# Patient Record
Sex: Male | Born: 1970 | Race: White | Hispanic: No | Marital: Single
Health system: Midwestern US, Community
[De-identification: ages and names within clinical notes are randomized; demographics above are authoritative.]

## PROBLEM LIST (undated history)

## (undated) DIAGNOSIS — R69 Illness, unspecified: Secondary | ICD-10-CM

## (undated) DIAGNOSIS — S46219A Strain of muscle, fascia and tendon of other parts of biceps, unspecified arm, initial encounter: Secondary | ICD-10-CM

## (undated) DIAGNOSIS — J45909 Unspecified asthma, uncomplicated: Secondary | ICD-10-CM

## (undated) DIAGNOSIS — Z22322 Carrier or suspected carrier of Methicillin resistant Staphylococcus aureus: Secondary | ICD-10-CM

## (undated) DIAGNOSIS — M5430 Sciatica, unspecified side: Secondary | ICD-10-CM

## (undated) HISTORY — PX: COLON SURGERY: SHX602

## (undated) HISTORY — PX: KNEE ARTHROSCOPY W/ DEBRIDEMENT: SHX1867

## (undated) HISTORY — PX: APPENDECTOMY: SHX54

## (undated) HISTORY — PX: FOOT SURGERY: SHX648

---

## 2004-07-25 ENCOUNTER — Emergency Department: Payer: Self-pay | Admitting: Emergency Medicine

## 2004-08-16 ENCOUNTER — Emergency Department: Payer: Self-pay | Admitting: Unknown Physician Specialty

## 2004-08-17 ENCOUNTER — Inpatient Hospital Stay: Payer: Self-pay | Admitting: Internal Medicine

## 2005-04-07 ENCOUNTER — Emergency Department: Payer: Self-pay | Admitting: Emergency Medicine

## 2005-08-30 ENCOUNTER — Emergency Department: Payer: Self-pay | Admitting: Emergency Medicine

## 2005-09-18 ENCOUNTER — Observation Stay: Payer: Self-pay | Admitting: General Surgery

## 2006-06-29 ENCOUNTER — Emergency Department: Payer: Self-pay | Admitting: General Practice

## 2006-10-22 ENCOUNTER — Emergency Department: Payer: Self-pay | Admitting: Emergency Medicine

## 2014-03-13 ENCOUNTER — Emergency Department: Payer: Self-pay | Admitting: Emergency Medicine

## 2014-05-20 ENCOUNTER — Emergency Department: Payer: Self-pay | Admitting: Internal Medicine

## 2014-06-06 ENCOUNTER — Emergency Department
Admit: 2014-06-06 | Disposition: A | Discharge status: Home / Self Care | Payer: Self-pay | Admitting: Emergency Medicine

## 2014-06-06 LAB — COMPREHENSIVE METABOLIC PANEL
ALT: 19 U/L
ANION GAP: 4 — AB (ref 7–16)
Albumin: 4.1 g/dL
Alkaline Phosphatase: 66 U/L
BUN: 15 mg/dL
Bilirubin,Total: 0.9 mg/dL
CO2: 25 mmol/L
Calcium, Total: 8.6 mg/dL — ABNORMAL LOW
Chloride: 108 mmol/L
Creatinine: 1.11 mg/dL
EGFR (African American): >60
Glucose: 126 mg/dL — ABNORMAL HIGH
POTASSIUM: 3.8 mmol/L
SGOT(AST): 20 U/L
Sodium: 137 mmol/L
Total Protein: 7.1 g/dL

## 2014-06-06 LAB — CBC WITH DIFFERENTIAL/PLATELET
BASOS ABS: 0.1 10*3/uL (ref 0.0–0.1)
BASOS PCT: 0.8 %
EOS ABS: 0.2 10*3/uL (ref 0.0–0.7)
Eosinophil %: 2.5 %
HCT: 51 % (ref 40.0–52.0)
HGB: 17 g/dL (ref 13.0–18.0)
Lymphocyte #: 1.2 10*3/uL (ref 1.0–3.6)
Lymphocyte %: 14.6 %
MCH: 28.9 pg (ref 26.0–34.0)
MCHC: 33.2 g/dL (ref 32.0–36.0)
MCV: 87 fL (ref 80–100)
MONO ABS: 0.8 x10 3/mm (ref 0.2–1.0)
Monocyte %: 10 %
Neutrophil #: 6 10*3/uL (ref 1.4–6.5)
Neutrophil %: 72.1 %
PLATELETS: 237 10*3/uL (ref 150–440)
RBC: 5.88 10*6/uL (ref 4.40–5.90)
RDW: 13.3 % (ref 11.5–14.5)
WBC: 8.3 10*3/uL (ref 3.8–10.6)

## 2014-06-06 LAB — URINALYSIS, COMPLETE
BILIRUBIN, UR: NEGATIVE
Bacteria: NONE SEEN
Glucose,UR: NEGATIVE mg/dL (ref 0–75)
KETONE: NEGATIVE
LEUKOCYTE ESTERASE: NEGATIVE
Nitrite: NEGATIVE
PH: 6 (ref 4.5–8.0)
PROTEIN: NEGATIVE
Specific Gravity: 1.01 (ref 1.003–1.030)
Squamous Epithelial: NONE SEEN

## 2014-06-06 LAB — LIPASE, BLOOD: Lipase: 55 U/L — ABNORMAL HIGH

## 2014-10-13 ENCOUNTER — Emergency Department (HOSPITAL_COMMUNITY)
Admission: EM | Admit: 2014-10-13 | Discharge: 2014-10-13 | Disposition: A | Discharge status: Home / Self Care | Payer: Self-pay | Attending: Emergency Medicine | Admitting: Emergency Medicine

## 2014-10-13 ENCOUNTER — Emergency Department (HOSPITAL_COMMUNITY): Payer: Self-pay

## 2014-10-13 ENCOUNTER — Encounter (HOSPITAL_COMMUNITY): Payer: Self-pay | Admitting: Emergency Medicine

## 2014-10-13 DIAGNOSIS — I10 Essential (primary) hypertension: Secondary | ICD-10-CM | POA: Insufficient documentation

## 2014-10-13 DIAGNOSIS — Z720 Tobacco use: Secondary | ICD-10-CM | POA: Insufficient documentation

## 2014-10-13 DIAGNOSIS — J45909 Unspecified asthma, uncomplicated: Secondary | ICD-10-CM | POA: Insufficient documentation

## 2014-10-13 DIAGNOSIS — M25561 Pain in right knee: Secondary | ICD-10-CM | POA: Insufficient documentation

## 2014-10-13 DIAGNOSIS — Z8614 Personal history of Methicillin resistant Staphylococcus aureus infection: Secondary | ICD-10-CM | POA: Insufficient documentation

## 2014-10-13 HISTORY — DX: Unspecified asthma, uncomplicated: J45.909

## 2014-10-13 HISTORY — DX: Sciatica, unspecified side: M54.30

## 2014-10-13 HISTORY — DX: Carrier or suspected carrier of methicillin resistant Staphylococcus aureus: Z22.322

## 2014-10-13 MED ORDER — IBUPROFEN 800 MG PO TABS: 800.0000 mg | ORAL_TABLET | Freq: Three times a day (TID) | ORAL | Status: DC

## 2014-10-13 MED ORDER — OXYCODONE-ACETAMINOPHEN 5-325 MG PO TABS: 1.0000 | ORAL_TABLET | ORAL | Status: DC | PRN

## 2014-10-13 NOTE — ED Provider Notes (Signed)
CSN: [id number]     Arrival date & time 10/13/14  1232 History   First MD Initiated Contact with Patient 10/13/14 1244     Chief Complaint  Patient presents with  . Knee Pain     (Consider location/radiation/quality/duration/timing/severity/associated sxs/prior Treatment) The history is provided by the patient.   Dean Pierce is a 44 y.o. male with a distant prior history of right knee pain and injury resulting in need for arthroscopic surgery when a teenager presenting with worsened pain along with popping and grinding at the medial knee joint space which is been worse for the past 4 days.  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  He denies swelling, redness at the site.  There is no radiation of pain which is worsened with certain movements specifically with valgus and varus strain of the knee joint.  He also endorses history of MRSA infection in his right lower extremity when he was a teenager.     Past Medical History  Diagnosis Date  . Asthma   . Hypertension   . 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 Sciatic nerve pain    Past Surgical History  Procedure Laterality Date  . Knee arthroscopy w/ debridement Right   . Foot surgery Right     Foot almost amputated  . Appendectomy    . Colon surgery     History reviewed. No pertinent family history. Social History  Substance Use Topics  . Smoking status: Current Every Day Smoker -- 1.00 packs/day    Types: Cigarettes  . Smokeless tobacco: Never Used  . Alcohol Use: Yes     Comment: occassionally    Review of Systems  Constitutional: Negative for fever.  Musculoskeletal: Positive for joint swelling and arthralgias. Negative for myalgias.  Neurological: Negative for weakness and numbness.      Allergies  Morphine and related  Home  Medications   Prior to Admission medications   Medication Sig Start Date End Date Taking? Authorizing Provider  ibuprofen (ADVIL,MOTRIN) 800 MG tablet Take 1 tablet (800 mg total) by mouth 3 (three) times daily. 10/13/14   Burgess Amor, PA-C  oxyCODONE-acetaminophen (PERCOCET/ROXICET) 5-325 MG per tablet Take 1 tablet by mouth every 4 (four) hours as needed. 10/13/14   Burgess Amor, PA-C   BP 112/76 mmHg  Pulse 86  Temp(Src) 98.3 F (36.8 C) (Oral)  Resp 18  Ht 6' (1.829 m)  Wt 220 lb (99.791 kg)  BMI 29.83 kg/m2  SpO2 98% Physical Exam  Constitutional: He appears well-developed and well-nourished.  HENT:  Head: Atraumatic.  Neck: Normal range of motion.  Cardiovascular:  Pulses equal bilaterally  Musculoskeletal: He exhibits tenderness.       Right knee: He exhibits LCL laxity and MCL laxity. He exhibits no swelling, no effusion, no ecchymosis, no deformity, no erythema and normal alignment. Tenderness found. Medial joint line tenderness noted.  Pain with varus and valgus stress.  Negative drawer test.  He does have mild crepitus with range of motion of the joint.  There is no redness, swelling or effusion.  He has no calf or thigh pain.  Neurological: He is alert. He has normal strength. He displays normal reflexes. No sensory deficit.  Skin: Skin is warm and dry.  Psychiatric: He has a normal mood and affect.  ED Course  Procedures (including critical care time) Labs Review Labs Reviewed - No data to display  Imaging Review Dg Knee Complete 4 Views Right  10/13/2014   CLINICAL DATA:  Acute right knee pain after repeated popping. Initial encounter.  EXAM: RIGHT KNEE - COMPLETE 4+ VIEW  COMPARISON:  None.  FINDINGS: There is no evidence of fracture, dislocation, or joint effusion. Mild narrowing of medial joint space is noted. Soft tissues are unremarkable.  IMPRESSION: Mild degenerative joint disease is noted medially. No acute abnormality seen.   Electronically Signed   By: Lupita Raider, M.D.   On: 10/13/2014 13:38   I  EKG Interpretation None      MDM   Final diagnoses:  Knee pain, acute, right     Imaging was reviewed, interpreted and I agree with radiologists reading.  Results were also discussed with patient.  Patient was placed on crutches, prescribed ibuprofen and oxycodone.  Encouraged ice, elevation.  Work note given.  Referral to Dr. Romeo Apple, patient to call for an appointment early next week.  Exam is consistent with medial knee joint problem, suspect either medial ligament strain or medial meniscal injury.     Burgess Amor, PA-C 10/13/14 1443  Gerhard Munch, MD 10/13/14 [phone]

## 2014-10-13 NOTE — Discharge Instructions (Signed)
Knee Pain °The knee is the complex joint between your thigh and your lower leg. It is made up of bones, tendons, ligaments, and cartilage. The bones that make up the knee are: °· The femur in the thigh. °· The tibia and fibula in the lower leg. °· The patella or kneecap riding in the groove on the lower femur. °CAUSES  °Knee pain is a common complaint with many causes. A few of these causes are: °· Injury, such as: °¨ A ruptured ligament or tendon injury. °¨ Torn cartilage. °· Medical conditions, such as: °¨ Gout °¨ Arthritis °¨ Infections °· Overuse, over training, or overdoing a physical activity. °Knee pain can be minor or severe. Knee pain can accompany debilitating injury. Minor knee problems often respond well to self-care measures or get well on their own. More serious injuries may need medical intervention or even surgery. °SYMPTOMS °The knee is complex. Symptoms of knee problems can vary widely. Some of the problems are: °· Pain with movement and weight bearing. °· Swelling and tenderness. °· Buckling of the knee. °· Inability to straighten or extend your knee. °· Your knee locks and you cannot straighten it. °· Warmth and redness with pain and fever. °· Deformity or dislocation of the kneecap. °DIAGNOSIS  °Determining what is wrong may be very straight forward such as when there is an injury. It can also be challenging because of the complexity of the knee. Tests to make a diagnosis may include: °· Your caregiver taking a history and doing a physical exam. °· Routine X-rays can be used to rule out other problems. X-rays will not reveal a cartilage tear. Some injuries of the knee can be diagnosed by: °¨ Arthroscopy a surgical technique by which a small video camera is inserted through tiny incisions on the sides of the knee. This procedure is used to examine and repair internal knee joint problems. Tiny instruments can be used during arthroscopy to repair the torn knee cartilage (meniscus). °¨ Arthrography  is a radiology technique. A contrast liquid is directly injected into the knee joint. Internal structures of the knee joint then become visible on X-ray film. °¨ An MRI scan is a non X-ray radiology procedure in which magnetic fields and a computer produce two- or three-dimensional images of the inside of the knee. Cartilage tears are often visible using an MRI scanner. MRI scans have largely replaced arthrography in diagnosing cartilage tears of the knee. °· Blood work. °· Examination of the fluid that helps to lubricate the knee joint (synovial fluid). This is done by taking a sample out using a needle and a syringe. °TREATMENT °The treatment of knee problems depends on the cause. Some of these treatments are: °· Depending on the injury, proper casting, splinting, surgery, or physical therapy care will be needed. °· Give yourself adequate recovery time. Do not overuse your joints. If you begin to get sore during workout routines, back off. Slow down or do fewer repetitions. °· For repetitive activities such as cycling or running, maintain your strength and nutrition. °· Alternate muscle groups. For example, if you are a weight lifter, work the upper body on one day and the lower body the next. °· Either tight or weak muscles do not give the proper support for your knee. Tight or weak muscles do not absorb the stress placed on the knee joint. Keep the muscles surrounding the knee strong. °· Take care of mechanical problems. °¨ If you have flat feet, orthotics or special shoes may help.   See your caregiver if you need help. °¨ Arch supports, sometimes with wedges on the inner or outer aspect of the heel, can help. These can shift pressure away from the side of the knee most bothered by osteoarthritis. °¨ A brace called an "unloader" brace also may be used to help ease the pressure on the most arthritic side of the knee. °· If your caregiver has prescribed crutches, braces, wraps or ice, use as directed. The acronym  for this is PRICE. This means protection, rest, ice, compression, and elevation. °· Nonsteroidal anti-inflammatory drugs (NSAIDs), can help relieve pain. But if taken immediately after an injury, they may actually increase swelling. Take NSAIDs with food in your stomach. Stop them if you develop stomach problems. Do not take these if you have a history of ulcers, stomach pain, or bleeding from the bowel. Do not take without your caregiver's approval if you have problems with fluid retention, heart failure, or kidney problems. °· For ongoing knee problems, physical therapy may be helpful. °· Glucosamine and chondroitin are over-the-counter dietary supplements. Both may help relieve the pain of osteoarthritis in the knee. These medicines are different from the usual anti-inflammatory drugs. Glucosamine may decrease the rate of cartilage destruction. °· Injections of a corticosteroid drug into your knee joint may help reduce the symptoms of an arthritis flare-up. They may provide pain relief that lasts a few months. You may have to wait a few months between injections. The injections do have a small increased risk of infection, water retention, and elevated blood sugar levels. °· Hyaluronic acid injected into damaged joints may ease pain and provide lubrication. These injections may work by reducing inflammation. A series of shots may give relief for as long as 6 months. °· Topical painkillers. Applying certain ointments to your skin may help relieve the pain and stiffness of osteoarthritis. Ask your pharmacist for suggestions. Many over the-counter products are approved for temporary relief of arthritis pain. °· In some countries, doctors often prescribe topical NSAIDs for relief of chronic conditions such as arthritis and tendinitis. A review of treatment with NSAID creams found that they worked as well as oral medications but without the serious side effects. °PREVENTION °· Maintain a healthy weight. Extra pounds  put more strain on your joints. °· Get strong, stay limber. Weak muscles are a common cause of knee injuries. Stretching is important. Include flexibility exercises in your workouts. °· Be smart about exercise. If you have osteoarthritis, chronic knee pain or recurring injuries, you may need to change the way you exercise. This does not mean you have to stop being active. If your knees ache after jogging or playing basketball, consider switching to swimming, water aerobics, or other low-impact activities, at least for a few days a week. Sometimes limiting high-impact activities will provide relief. °· Make sure your shoes fit well. Choose footwear that is right for your sport. °· Protect your knees. Use the proper gear for knee-sensitive activities. Use kneepads when playing volleyball or laying carpet. Buckle your seat belt every time you drive. Most shattered kneecaps occur in car accidents. °· Rest when you are tired. °SEEK MEDICAL CARE IF:  °You have knee pain that is continual and does not seem to be getting better.  °SEEK IMMEDIATE MEDICAL CARE IF:  °Your knee joint feels hot to the touch and you have a high fever. °MAKE SURE YOU:  °· Understand these instructions. °· Will watch your condition. °· Will get help right away if you are not   doing well or get worse. Document Released: 12/15/2006 Document Revised: 05/12/2011 Document Reviewed: 12/15/2006 [facility] Patient Information 2015 [place], [place]. This information is not intended to replace advice given to you by your health care provider. Make sure you discuss any questions you have with your health care provider.   Your x-rays are negative today for acute source of your pain, although you do have mild arthritis changes in your knee.  I suspect you may have either a medial ligament of meniscal injury (cartilage).  Ice and elevate as much as possible for the next several days.  Use crutches to minimize weightbearing.  Take the anti-inflammatory  prescribed.  You may use the oxycodone if needed for additional pain relief, do not drive with inferoseptal taking this medicine as it will make you drowsy.

## 2014-10-13 NOTE — ED Notes (Signed)
Pain started in rt knee on Monday or Tuesday - Has been getting increasingly more painful with popping and grinding - no Known injury

## 2014-11-05 ENCOUNTER — Emergency Department: Payer: No Typology Code available for payment source

## 2014-11-05 ENCOUNTER — Emergency Department
Admission: EM | Admit: 2014-11-05 | Discharge: 2014-11-05 | Disposition: A | Discharge status: Home / Self Care | Payer: No Typology Code available for payment source | Attending: Emergency Medicine | Admitting: Emergency Medicine

## 2014-11-05 DIAGNOSIS — Y998 Other external cause status: Secondary | ICD-10-CM | POA: Insufficient documentation

## 2014-11-05 DIAGNOSIS — Y9389 Activity, other specified: Secondary | ICD-10-CM | POA: Diagnosis not present

## 2014-11-05 DIAGNOSIS — Z720 Tobacco use: Secondary | ICD-10-CM | POA: Diagnosis not present

## 2014-11-05 DIAGNOSIS — Y92410 Unspecified street and highway as the place of occurrence of the external cause: Secondary | ICD-10-CM | POA: Insufficient documentation

## 2014-11-05 DIAGNOSIS — S8002XA Contusion of left knee, initial encounter: Secondary | ICD-10-CM | POA: Diagnosis not present

## 2014-11-05 DIAGNOSIS — I10 Essential (primary) hypertension: Secondary | ICD-10-CM | POA: Insufficient documentation

## 2014-11-05 DIAGNOSIS — T07XXXA Unspecified multiple injuries, initial encounter: Secondary | ICD-10-CM

## 2014-11-05 DIAGNOSIS — S8992XA Unspecified injury of left lower leg, initial encounter: Secondary | ICD-10-CM | POA: Diagnosis present

## 2014-11-05 MED ORDER — HYDROMORPHONE HCL 1 MG/ML IJ SOLN
1.0000 mg | Freq: Once | INTRAMUSCULAR | Status: AC
  Administered 2014-11-05: 1 mg via INTRAVENOUS
  Filled 2014-11-05: qty 1

## 2014-11-05 NOTE — ED Notes (Signed)
Rigid c collar applied, cms intact before and after application to all extremities.
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
## 2014-11-05 NOTE — Discharge Instructions (Signed)
Contusion A contusion is a deep bruise. Contusions are the result of an injury that caused bleeding under the skin. The contusion may turn blue, purple, or yellow. Minor injuries will give you a painless contusion, but more severe contusions may stay painful and swollen for a few weeks.  CAUSES  A contusion is usually caused by a blow, trauma, or direct force to an area of the body. SYMPTOMS   Swelling and redness of the injured area.  Bruising of the injured area.  Tenderness and soreness of the injured area.  Pain. DIAGNOSIS  The diagnosis can be made by taking a history and physical exam. An X-ray, CT scan, or MRI may be needed to determine if there were any associated injuries, such as fractures. TREATMENT  Specific treatment will depend on what area of the body was injured. In general, the best treatment for a contusion is resting, icing, elevating, and applying cold compresses to the injured area. Over-the-counter medicines may also be recommended for pain control. Ask your caregiver what the best treatment is for your contusion. HOME CARE INSTRUCTIONS   Put ice on the injured area.  Put ice in a plastic bag.  Place a towel between your skin and the bag.  Leave the ice on for 15-20 minutes, 3-4 times a day, or as directed by your health care provider.  Only take over-the-counter or prescription medicines for pain, discomfort, or fever as directed by your caregiver. Your caregiver may recommend avoiding anti-inflammatory medicines (aspirin, ibuprofen, and naproxen) for 48 hours because these medicines may increase bruising.  Rest the injured area.  If possible, elevate the injured area to reduce swelling. SEEK IMMEDIATE MEDICAL CARE IF:   You have increased bruising or swelling.  You have pain that is getting worse.  Your swelling or pain is not relieved with medicines. MAKE SURE YOU:   Understand these instructions.  Will watch your condition.  Will get help right  away if you are not doing well or get worse. Document Released: 11/27/2004 Document Revised: 02/22/2013 Document Reviewed: 12/23/2010 [facility] Patient Information 2015 [place], [place]. This information is not intended to replace advice given to you by your health care provider. Make sure you discuss any questions you have with your health care provider. Please return for worse or new pain and follow-up with your regular doctor for the contusion sister having none of the x-ray showed anything broken. Use Motrin or Tylenol for the pain for now we cannot give a narcotic pain medications for this sort of pain any longer.
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
## 2014-11-09 ENCOUNTER — Emergency Department (HOSPITAL_COMMUNITY)
Admission: EM | Admit: 2014-11-09 | Discharge: 2014-11-09 | Disposition: A | Discharge status: Home / Self Care | Payer: No Typology Code available for payment source | Attending: Emergency Medicine | Admitting: Emergency Medicine

## 2014-11-09 ENCOUNTER — Emergency Department (HOSPITAL_COMMUNITY): Payer: No Typology Code available for payment source

## 2014-11-09 ENCOUNTER — Encounter (HOSPITAL_COMMUNITY): Payer: Self-pay | Admitting: Emergency Medicine

## 2014-11-09 DIAGNOSIS — Z79899 Other long term (current) drug therapy: Secondary | ICD-10-CM | POA: Insufficient documentation

## 2014-11-09 DIAGNOSIS — Z720 Tobacco use: Secondary | ICD-10-CM | POA: Insufficient documentation

## 2014-11-09 DIAGNOSIS — Z91040 Latex allergy status: Secondary | ICD-10-CM | POA: Diagnosis not present

## 2014-11-09 DIAGNOSIS — I10 Essential (primary) hypertension: Secondary | ICD-10-CM | POA: Diagnosis not present

## 2014-11-09 DIAGNOSIS — Z8614 Personal history of Methicillin resistant Staphylococcus aureus infection: Secondary | ICD-10-CM | POA: Diagnosis not present

## 2014-11-09 DIAGNOSIS — Z8739 Personal history of other diseases of the musculoskeletal system and connective tissue: Secondary | ICD-10-CM | POA: Diagnosis not present

## 2014-11-09 DIAGNOSIS — Z791 Long term (current) use of non-steroidal anti-inflammatories (NSAID): Secondary | ICD-10-CM | POA: Diagnosis not present

## 2014-11-09 DIAGNOSIS — S46212A Strain of muscle, fascia and tendon of other parts of biceps, left arm, initial encounter: Secondary | ICD-10-CM | POA: Diagnosis not present

## 2014-11-09 DIAGNOSIS — Y9389 Activity, other specified: Secondary | ICD-10-CM | POA: Diagnosis not present

## 2014-11-09 DIAGNOSIS — Y92410 Unspecified street and highway as the place of occurrence of the external cause: Secondary | ICD-10-CM | POA: Diagnosis not present

## 2014-11-09 DIAGNOSIS — Y998 Other external cause status: Secondary | ICD-10-CM | POA: Insufficient documentation

## 2014-11-09 DIAGNOSIS — J45909 Unspecified asthma, uncomplicated: Secondary | ICD-10-CM | POA: Insufficient documentation

## 2014-11-09 DIAGNOSIS — S46812A Strain of other muscles, fascia and tendons at shoulder and upper arm level, left arm, initial encounter: Secondary | ICD-10-CM | POA: Diagnosis not present

## 2014-11-09 DIAGNOSIS — S4992XA Unspecified injury of left shoulder and upper arm, initial encounter: Secondary | ICD-10-CM | POA: Diagnosis present

## 2014-11-09 MED ORDER — OXYCODONE-ACETAMINOPHEN 5-325 MG PO TABS
1.0000 | ORAL_TABLET | Freq: Once | ORAL | Status: DC
  Filled 2014-11-09: qty 1

## 2014-11-09 MED ORDER — IBUPROFEN 800 MG PO TABS
800.0000 mg | ORAL_TABLET | Freq: Once | ORAL | Status: AC
  Administered 2014-11-09: 800 mg via ORAL
  Filled 2014-11-09: qty 1

## 2014-11-09 MED ORDER — METHOCARBAMOL 500 MG PO TABS: 500.0000 mg | ORAL_TABLET | Freq: Three times a day (TID) | ORAL | Status: DC

## 2014-11-09 NOTE — Discharge Instructions (Signed)
Your examination is consistent with trapezius strain and bicep tricep strain on the left. Please use your sling over the next 4 or 5 days. Please continue your current medication. Please add Robaxin 3 times daily. Please see Dr. Hilda Lias, or the orthopedic specialist of your choice if not improving by Monday, September 12. Shoulder Sprain A shoulder sprain is the result of damage to the tough, fiber-like tissues (ligaments) that help hold your shoulder in place. The ligaments may be stretched or torn. Besides the main shoulder joint (the ball and socket), there are several smaller joints that connect the bones in this area. A sprain usually involves one of those joints. Most often it is the acromioclavicular (or AC) joint. That is the joint that connects the collarbone (clavicle) and the shoulder blade (scapula) at the top point of the shoulder blade (acromion). A shoulder sprain is a mild form of what is called a shoulder separation. Recovering from a shoulder sprain may take some time. For some, pain lingers for several months. Most people recover without long term problems. CAUSES   A shoulder sprain is usually caused by some kind of trauma. This might be:  Falling on an outstretched arm.  Being hit hard on the shoulder.  Twisting the arm.  Shoulder sprains are more likely to occur in people who:  Play sports.  Have balance or coordination problems. SYMPTOMS   Pain when you move your shoulder.  Limited ability to move the shoulder.  Swelling and tenderness on top of the shoulder.  Redness or warmth in the shoulder.  Bruising.  A change in the shape of the shoulder. DIAGNOSIS  Your healthcare provider may:  Ask about your symptoms.  Ask about recent activity that might have caused those symptoms.  Examine your shoulder. You may be asked to do simple exercises to test movement. The other shoulder will be examined for comparison.  Order some tests that provide a look inside  the body. They can show the extent of the injury. The tests could include:  X-rays.  CT (computed tomography) scan.  MRI (magnetic resonance imaging) scan. RISKS AND COMPLICATIONS  Loss of full shoulder motion.  Ongoing shoulder pain. TREATMENT  How long it takes to recover from a shoulder sprain depends on how severe it was. Treatment options may include:  Rest. You should not use the arm or shoulder until it heals.  Ice. For 2 or 3 days after the injury, put an ice pack on the shoulder up to 4 times a day. It should stay on for 15 to 20 minutes each time. Wrap the ice in a towel so it does not touch your skin.  Over-the-counter medicine to relieve pain.  A sling or brace. This will keep the arm still while the shoulder is healing.  Physical therapy or rehabilitation exercises. These will help you regain strength and motion. Ask your healthcare provider when it is OK to begin these exercises.  Surgery. The need for surgery is rare with a sprained shoulder, but some people may need surgery to keep the joint in place and reduce pain. HOME CARE INSTRUCTIONS   Ask your healthcare provider about what you should and should not do while your shoulder heals.  Make sure you know how to apply ice to the correct area of your shoulder.  Talk with your healthcare provider about which medications should be used for pain and swelling.  If rehabilitation therapy will be needed, ask your healthcare provider to refer you to a  therapist. If it is not recommended, then ask about at-home exercises. Find out when exercise should begin. SEEK MEDICAL CARE IF:  Your pain, swelling, or redness at the joint increases. SEEK IMMEDIATE MEDICAL CARE IF:   You have a fever.  You cannot move your arm or shoulder. Document Released: 07/06/2008 Document Revised: 05/12/2011 Document Reviewed: 07/06/2008 [facility] Patient Information 2015 [employer], [place]. This information is not intended to replace advice  given to you by your health care provider. Make sure you discuss any questions you have with your health care provider.
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
## 2014-11-09 NOTE — ED Notes (Signed)
Pt verbalized understanding of no driving and to use caution within 4 hours of taking pain meds due to meds cause drowsiness 

## 2015-01-27 ENCOUNTER — Emergency Department (HOSPITAL_COMMUNITY): Payer: Self-pay

## 2015-01-27 ENCOUNTER — Encounter (HOSPITAL_COMMUNITY): Payer: Self-pay | Admitting: *Deleted

## 2015-01-27 ENCOUNTER — Emergency Department (HOSPITAL_COMMUNITY)
Admission: EM | Admit: 2015-01-27 | Discharge: 2015-01-28 | Disposition: A | Discharge status: Home / Self Care | Payer: Self-pay | Attending: Emergency Medicine | Admitting: Emergency Medicine

## 2015-01-27 DIAGNOSIS — Z8614 Personal history of Methicillin resistant Staphylococcus aureus infection: Secondary | ICD-10-CM | POA: Insufficient documentation

## 2015-01-27 DIAGNOSIS — J209 Acute bronchitis, unspecified: Secondary | ICD-10-CM

## 2015-01-27 DIAGNOSIS — Z91040 Latex allergy status: Secondary | ICD-10-CM | POA: Insufficient documentation

## 2015-01-27 DIAGNOSIS — J45901 Unspecified asthma with (acute) exacerbation: Secondary | ICD-10-CM | POA: Insufficient documentation

## 2015-01-27 DIAGNOSIS — F17210 Nicotine dependence, cigarettes, uncomplicated: Secondary | ICD-10-CM | POA: Insufficient documentation

## 2015-01-27 DIAGNOSIS — H9209 Otalgia, unspecified ear: Secondary | ICD-10-CM | POA: Insufficient documentation

## 2015-01-27 MED ORDER — HYDROCODONE-ACETAMINOPHEN 5-325 MG PO TABS
1.0000 | ORAL_TABLET | Freq: Once | ORAL | Status: AC
  Administered 2015-01-27: 1 via ORAL
  Filled 2015-01-27: qty 1

## 2015-01-27 MED ORDER — LEVALBUTEROL HCL 1.25 MG/0.5ML IN NEBU
1.2500 mg | INHALATION_SOLUTION | Freq: Once | RESPIRATORY_TRACT | Status: AC
  Administered 2015-01-27: 1.25 mg via RESPIRATORY_TRACT
  Filled 2015-01-27: qty 0.5

## 2015-01-27 MED ORDER — PREDNISONE 50 MG PO TABS
60.0000 mg | ORAL_TABLET | Freq: Once | ORAL | Status: AC
  Administered 2015-01-27: 60 mg via ORAL
  Filled 2015-01-27: qty 1

## 2015-01-27 NOTE — ED Notes (Addendum)
Pt c/o chest and nasal congestion, cough, headache, ear pain, and body aches since Thursday night.  Pt c/o chest pain when he coughs and feels gassy.

## 2015-01-28 MED ORDER — HYDROCODONE-ACETAMINOPHEN 5-325 MG PO TABS: 1.0000 | ORAL_TABLET | ORAL | Status: DC | PRN

## 2015-01-28 MED ORDER — PREDNISONE 10 MG PO TABS: ORAL_TABLET | ORAL | Status: DC

## 2015-01-28 MED ORDER — ALBUTEROL SULFATE HFA 108 (90 BASE) MCG/ACT IN AERS
1.0000 | INHALATION_SPRAY | Freq: Once | RESPIRATORY_TRACT | Status: AC
  Administered 2015-01-28: 1 via RESPIRATORY_TRACT
  Filled 2015-01-28: qty 6.7

## 2015-01-28 NOTE — Discharge Instructions (Signed)
Acute Bronchitis Bronchitis is inflammation of the airways that extend from the windpipe into the lungs (bronchi). The inflammation often causes mucus to develop. This leads to a cough, which is the most common symptom of bronchitis.  In acute bronchitis, the condition usually develops suddenly and goes away over time, usually in a couple weeks. Smoking, allergies, and asthma can make bronchitis worse. Repeated episodes of bronchitis may cause further lung problems.  CAUSES Acute bronchitis is most often caused by the same virus that causes a cold. The virus can spread from person to person (contagious) through coughing, sneezing, and touching contaminated objects. SIGNS AND SYMPTOMS   Cough.   Fever.   Coughing up mucus.   Body aches.   Chest congestion.   Chills.   Shortness of breath.   Sore throat.  DIAGNOSIS  Acute bronchitis is usually diagnosed through a physical exam. Your health care provider will also ask you questions about your medical history. Tests, such as chest X-rays, are sometimes done to rule out other conditions.  TREATMENT  Acute bronchitis usually goes away in a couple weeks. Oftentimes, no medical treatment is necessary. Medicines are sometimes given for relief of fever or cough. Antibiotic medicines are usually not needed but may be prescribed in certain situations. In some cases, an inhaler may be recommended to help reduce shortness of breath and control the cough. A cool mist vaporizer may also be used to help thin bronchial secretions and make it easier to clear the chest.  HOME CARE INSTRUCTIONS  Get plenty of rest.   Drink enough fluids to keep your urine clear or pale yellow (unless you have a medical condition that requires fluid restriction). Increasing fluids may help thin your respiratory secretions (sputum) and reduce chest congestion, and it will prevent dehydration.   Take medicines only as directed by your health care provider.  If  you were prescribed an antibiotic medicine, finish it all even if you start to feel better.  Avoid smoking and secondhand smoke. Exposure to cigarette smoke or irritating chemicals will make bronchitis worse. If you are a smoker, consider using nicotine gum or skin patches to help control withdrawal symptoms. Quitting smoking will help your lungs heal faster.   Reduce the chances of another bout of acute bronchitis by washing your hands frequently, avoiding people with cold symptoms, and trying not to touch your hands to your mouth, nose, or eyes.   Keep all follow-up visits as directed by your health care provider.  SEEK MEDICAL CARE IF: Your symptoms do not improve after 1 week of treatment.  SEEK IMMEDIATE MEDICAL CARE IF:  You develop an increased fever or chills.   You have chest pain.   You have severe shortness of breath.  You have bloody sputum.   You develop dehydration.  You faint or repeatedly feel like you are going to pass out.  You develop repeated vomiting.  You develop a severe headache. MAKE SURE YOU:   Understand these instructions.  Will watch your condition.  Will get help right away if you are not doing well or get worse.   This information is not intended to replace advice given to you by your health care provider. Make sure you discuss any questions you have with your health care provider.   Document Released: 03/27/2004 Document Revised: 03/10/2014 Document Reviewed: 08/10/2012 Elsevier Interactive Patient Education 2016 [employer]   Take your next dose of prednisone tomorrow evening.  Use 1-2 puffs of your albuterol inhaler every  4 hours if you are wheezing, coughing or short of breath.  You may take the hydrocodone prescribed for assistance with cough suppression.  This will make you drowsy - do not drive within 4 hours of taking this medication.

## 2015-01-28 NOTE — ED Notes (Signed)
Pt alert & oriented x4, stable gait. Patient given discharge instructions, paperwork & prescription(s). Patient  instructed to stop at the registration desk to finish any additional paperwork. Patient verbalized understanding. Pt left department w/ no further questions. 

## 2015-01-29 NOTE — ED Provider Notes (Signed)
CSN: [id number]     Arrival date & time 01/27/15  1849 History   First MD Initiated Contact with Patient 01/27/15 2233     Chief Complaint  Patient presents with  . Nasal Congestion     (Consider location/radiation/quality/duration/timing/severity/associated sxs/prior Treatment) The history is provided by the patient and the spouse.   Dean Pierce is a 44 y.o. male with a history of asthma, presenting with a 2 day history of cough productive of yellow sputum, wheezing which is worsened when supine, chest congestion and tightness along with nasal congestion, ear and headache.  He describes burning chest pain with coughing and shortness of breath.  He has taken otc cough remedy mucinex without relief.  He does not currently have any asthma medicines, reports has not had active wheezing or asthma flair in over a year.      Past Medical History  Diagnosis Date  . Asthma   . 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 Sciatic nerve pain    Past Surgical History  Procedure Laterality Date  . Knee arthroscopy w/ debridement Right   . Foot surgery Right     Foot almost amputated  . Appendectomy    . Colon surgery     History reviewed. No pertinent family history. Social History  Substance Use Topics  . Smoking status: Current Every Day Smoker -- 1.00 packs/day    Types: Cigarettes  . Smokeless tobacco: Never Used  . Alcohol Use: Yes     Comment: occassionally    Review of Systems  Constitutional: Negative for fever and chills.  HENT: Positive for congestion, ear pain, postnasal drip, rhinorrhea and sore throat. Negative for ear discharge, sinus pressure, trouble swallowing and voice change.   Eyes: Negative for discharge.  Respiratory: Positive for cough, chest tightness, shortness of breath and wheezing. Negative for stridor.   Cardiovascular: Negative for chest pain.  Gastrointestinal: Negative for nausea, vomiting and abdominal  pain.  Genitourinary: Negative.   Musculoskeletal: Positive for arthralgias.  Skin: Negative.       Allergies  Codeine; Morphine and related; and Latex  Home Medications   Prior to Admission medications   Medication Sig Start Date End Date Taking? Authorizing Provider  PE-Diphenhydramine-DM-GG-APAP (MUCINEX FAST-MAX DAY/NIGHT TAB PO) Take 2 tablets by mouth every 4 (four) hours as needed (for congestion).   Yes Historical Provider, MD  HYDROcodone-acetaminophen (NORCO/VICODIN) 5-325 MG tablet Take 1 tablet by mouth every 4 (four) hours as needed (cough). 01/28/15   Burgess Amor, PA-C  ibuprofen (ADVIL,MOTRIN) 800 MG tablet Take 1 tablet (800 mg total) by mouth 3 (three) times daily. Patient not taking: Reported on 01/27/2015 10/13/14   Burgess Amor, PA-C  methocarbamol (ROBAXIN) 500 MG tablet Take 1 tablet (500 mg total) by mouth 3 (three) times daily. Patient not taking: Reported on 01/27/2015 11/09/14   Ivery Quale, PA-C  oxyCODONE-acetaminophen (PERCOCET/ROXICET) 5-325 MG per tablet Take 1 tablet by mouth every 4 (four) hours as needed. Patient not taking: Reported on 01/27/2015 10/13/14   Burgess Amor, PA-C  predniSONE (DELTASONE) 10 MG tablet 6, 5, 4, 3, 2 then 1 tablet by mouth daily for 6 days total. 01/28/15   Burgess Amor, PA-C   BP 125/70 mmHg  Pulse 87  Temp(Src) 98.6 F (37 C) (Oral)  Resp 20  Ht 6' (1.829 m)  Wt 99.791 kg  BMI 29.83 kg/m2  SpO2 95% Physical Exam  Constitutional: He is oriented to person, place, and  time. He appears well-developed and well-nourished.  HENT:  Head: Normocephalic and atraumatic.  Right Ear: Tympanic membrane and ear canal normal.  Left Ear: Tympanic membrane and ear canal normal.  Nose: Mucosal edema and rhinorrhea present.  Mouth/Throat: Uvula is midline, oropharynx is clear and moist and mucous membranes are normal. No oropharyngeal exudate, posterior oropharyngeal edema, posterior oropharyngeal erythema or tonsillar abscesses.  Eyes:  Conjunctivae are normal.  Neck: Full passive range of motion without pain.  Cardiovascular: Normal rate and normal heart sounds.   Pulmonary/Chest: Effort normal. No respiratory distress. He has wheezes. He has no rales.  Wheezing throughout with prolonged expirations. No rhonchi appreciated.  Abdominal: Soft. There is no tenderness.  Musculoskeletal: Normal range of motion.  Neurological: He is alert and oriented to person, place, and time.  Skin: Skin is warm and dry. No rash noted.  Psychiatric: He has a normal mood and affect.    ED Course  Procedures (including critical care time)  Imaging Review    CLINICAL DATA: Productive cough wheezing shortness of breath headache for 2 days  EXAM: CHEST 2 VIEW  COMPARISON: 05/20/2014  FINDINGS: The heart size and mediastinal contours are within normal limits. Both lungs are clear. The visualized skeletal structures are unremarkable.  IMPRESSION: No active cardiopulmonary disease.   Electronically Signed By: Esperanza Heiraymond Rubner M.D. On: 01/27/2015 21:10    I have personally reviewed and evaluated these images and lab results as part of my medical decision-making.   EKG Interpretation   Date/Time:  Saturday January 27 2015 22:10:13 EST Ventricular Rate:  90 PR Interval:  131 QRS Duration: 92 QT Interval:  352 QTC Calculation: 431 R Axis:   83 Text Interpretation:  Sinus rhythm Baseline wander When compared with ECG  of 05/20/2014 No significant change was found Confirmed by [facility]  MD,  Nicholos JohnsKATHLEEN [id number]) on 01/27/2015 11:01:38 PM      MDM   Final diagnoses:  Acute bronchitis with bronchospasm    Pt given xopenex as he endorses jitteriness with albuterol nebs.  Prednisone 60 mg PO given.  Hydrocodone tab for cough suppression.  Pt re-examined and had much improved aeration with less wheezing throughout.  He was given an albuterol mdi for home use, prednisone taper, hydrocodone for cough suppression.  Advised  recheck here for any worsened sx.  Rest, increased fluid intake, may continue mucinex for congestion sx.    The patient appears reasonably screened and/or stabilized for discharge and I doubt any other medical condition or other [facility] requiring further screening, evaluation, or treatment in the ED at this time prior to discharge.     Burgess AmorJulie Leeta Grimme, PA-C 01/29/15 2349  Dione Boozeavid Glick, MD 01/30/15 [phone]

## 2015-12-07 ENCOUNTER — Encounter: Payer: Self-pay | Admitting: Emergency Medicine

## 2015-12-07 ENCOUNTER — Emergency Department: Payer: Self-pay

## 2015-12-07 ENCOUNTER — Emergency Department
Admission: EM | Admit: 2015-12-07 | Discharge: 2015-12-07 | Disposition: A | Discharge status: Home / Self Care | Payer: Self-pay | Attending: Emergency Medicine | Admitting: Emergency Medicine

## 2015-12-07 DIAGNOSIS — Z791 Long term (current) use of non-steroidal anti-inflammatories (NSAID): Secondary | ICD-10-CM | POA: Insufficient documentation

## 2015-12-07 DIAGNOSIS — F17210 Nicotine dependence, cigarettes, uncomplicated: Secondary | ICD-10-CM | POA: Insufficient documentation

## 2015-12-07 DIAGNOSIS — J45909 Unspecified asthma, uncomplicated: Secondary | ICD-10-CM | POA: Insufficient documentation

## 2015-12-07 DIAGNOSIS — Z79899 Other long term (current) drug therapy: Secondary | ICD-10-CM | POA: Insufficient documentation

## 2015-12-07 DIAGNOSIS — R0789 Other chest pain: Secondary | ICD-10-CM | POA: Insufficient documentation

## 2015-12-07 LAB — BASIC METABOLIC PANEL
Anion gap: 5 (ref 5–15)
BUN: 14 mg/dL (ref 6–20)
CALCIUM: 9.2 mg/dL (ref 8.9–10.3)
CO2: 28 mmol/L (ref 22–32)
CREATININE: 1.13 mg/dL (ref 0.61–1.24)
Chloride: 107 mmol/L (ref 101–111)
GFR calc non Af Amer: >60 mL/min (ref >60)
GLUCOSE: 99 mg/dL (ref 65–99)
Potassium: 4.1 mmol/L (ref 3.5–5.1)
Sodium: 140 mmol/L (ref 135–145)

## 2015-12-07 LAB — TROPONIN I: Troponin I: <0.03 ng/mL (ref <0.03)

## 2015-12-07 LAB — CBC
HEMATOCRIT: 48.9 % (ref 40.0–52.0)
Hemoglobin: 17.1 g/dL (ref 13.0–18.0)
MCH: 30.1 pg (ref 26.0–34.0)
MCHC: 34.9 g/dL (ref 32.0–36.0)
MCV: 86.1 fL (ref 80.0–100.0)
Platelets: 176 10*3/uL (ref 150–440)
RBC: 5.68 MIL/uL (ref 4.40–5.90)
RDW: 13.2 % (ref 11.5–14.5)
WBC: 7.5 10*3/uL (ref 3.8–10.6)

## 2015-12-07 LAB — HEPATIC FUNCTION PANEL
ALK PHOS: 57 U/L (ref 38–126)
ALT: 21 U/L (ref 17–63)
AST: 19 U/L (ref 15–41)
Albumin: 4.1 g/dL (ref 3.5–5.0)
Bilirubin, Direct: 0.1 mg/dL (ref 0.1–0.5)
Indirect Bilirubin: 1.1 mg/dL — ABNORMAL HIGH (ref 0.3–0.9)
Total Bilirubin: 1.2 mg/dL (ref 0.3–1.2)
Total Protein: 6.9 g/dL (ref 6.5–8.1)

## 2015-12-07 LAB — LIPASE, BLOOD: LIPASE: 18 U/L (ref 11–51)

## 2015-12-07 NOTE — ED Provider Notes (Signed)
[facility] Emergency Department Provider Note  ____________________________________________   I have reviewed the triage vital signs and the nursing notes.   HISTORY  Chief Complaint Chest Pain    HPI Dean Pierce is a 45 y.o. male who presents today complaining of chest wall pain. He states he feels "sore". Patient tore a biceps muscle and has had ongoing recurrent pectoralis muscle on that side for several months. He states that it feels like that but he also has acid indigestion and sometimes that acts up. He is not sure what is whatsoever to come and get checked out. The pain is in the costochondral region, is worse when he moves his arm he does frequent our motions or work. He denies any exertional symptoms he is not short of breath and is not pleuritic ascending or calf pain or swelling he has no personal or family history of PE DVT he's had no recent travel, he is not taking any estrogen obviously and he has had no recent surgery. Patient has no risk factors for PE and otherwise. He states that he has had this pain for 2 weeks. It is worse when he moves his arm at work where he has to carry 15-20 pound objects. It is not worse when he exercises otherwise or exerts himself.It is better when he takes pain medication. There is no radiation. No other associated abdominal pain. Hurts when he touches it. He is very sure it is a pulled muscle but he wants to ensure that there is no other pathology present.     Past Medical History:  Diagnosis Date  . Asthma   . MRSA (methicillin resistant Staphylococcus aureus) carrier    Infection rt leg  . Sciatic nerve pain     There are no active problems to display for this patient.   Past Surgical History:  Procedure Laterality Date  . APPENDECTOMY    . COLON SURGERY    . FOOT SURGERY Right    Foot almost amputated  . KNEE ARTHROSCOPY W/ DEBRIDEMENT Right     Prior to Admission medications   Medication Sig  Start Date End Date Taking? Authorizing Provider  HYDROcodone-acetaminophen (NORCO/VICODIN) 5-325 MG tablet Take 1 tablet by mouth every 4 (four) hours as needed (cough). 01/28/15   Burgess Amor, PA-C  ibuprofen (ADVIL,MOTRIN) 800 MG tablet Take 1 tablet (800 mg total) by mouth 3 (three) times daily. Patient not taking: Reported on 01/27/2015 10/13/14   Burgess Amor, PA-C  methocarbamol (ROBAXIN) 500 MG tablet Take 1 tablet (500 mg total) by mouth 3 (three) times daily. Patient not taking: Reported on 01/27/2015 11/09/14   Ivery Quale, PA-C  oxyCODONE-acetaminophen (PERCOCET/ROXICET) 5-325 MG per tablet Take 1 tablet by mouth every 4 (four) hours as needed. Patient not taking: Reported on 01/27/2015 10/13/14   Burgess Amor, PA-C  PE-Diphenhydramine-DM-GG-APAP (MUCINEX FAST-MAX DAY/NIGHT TAB PO) Take 2 tablets by mouth every 4 (four) hours as needed (for congestion).    Historical Provider, MD  predniSONE (DELTASONE) 10 MG tablet 6, 5, 4, 3, 2 then 1 tablet by mouth daily for 6 days total. 01/28/15   Burgess Amor, PA-C    Allergies Codeine; Morphine and related; and Latex  No family history on file.  Social History Social History  Substance Use Topics  . Smoking status: Current Every Day Smoker    Packs/day: 0.50    Types: Cigarettes  . Smokeless tobacco: Never Used  . Alcohol use No     Comment: occassionally  Review of Systems Constitutional: No fever/chills Eyes: No visual changes. ENT: No sore throat. No stiff neck no neck pain Cardiovascular: Denies chest pain. Respiratory: Denies shortness of breath. Gastrointestinal:   no vomiting.  No diarrhea.  No constipation. Genitourinary: Negative for dysuria. Musculoskeletal: Negative lower extremity swelling Skin: Negative for rash. Neurological: Negative for severe headaches, focal weakness or numbness. 10-point ROS otherwise negative.  ____________________________________________   PHYSICAL EXAM:  VITAL SIGNS: ED Triage Vitals  [12/07/15 1226]  Enc Vitals Group     BP (!) 134/93     Pulse Rate 83     Resp 18     Temp 98.7 F (37.1 C)     Temp Source Oral     SpO2 98 %     Weight 230 lb (104.3 kg)     Height 6' (1.829 m)     Head Circumference      Peak Flow      Pain Score 2     Pain Loc      Pain Edu?      Excl. in GC?     Constitutional: Alert and oriented. Well appearing and in no acute distress. Eyes: Conjunctivae are normal. PERRL. EOMI. Head: Atraumatic. Nose: No congestion/rhinnorhea. Mouth/Throat: Mucous membranes are moist.  Oropharynx non-erythematous. Neck: No stridor.   Nontender with no meningismus Cardiovascular: Normal rate, regular rhythm. Grossly normal heart sounds.  Good peripheral circulation. Respiratory: Normal respiratory effort.  No retractions. Lungs CTAB. Chest: Tender to palpation in the bilateral chest wall anteriorly at the costochondral margin metastasis area patient states "ouch that's the pain right there" and pulls back. There is no evidence of shingles or crepitus or flail chest or any other lesions noted. There is no cellulitis. There is no inflammation externally noted. There is no mass.  Abdominal: Soft and nontender. No distention. No guarding no rebound Back:  There is no focal tenderness or step off.  there is no midline tenderness there are no lesions noted. there is no CVA tenderness Musculoskeletal: No lower extremity tenderness, no upper extremity tenderness. No joint effusions, no DVT signs strong distal pulses no edema Neurologic:  Normal speech and language. No gross focal neurologic deficits are appreciated.  Skin:  Skin is warm, dry and intact. No rash noted. Psychiatric: Mood and affect are normal. Speech and behavior are normal.  ____________________________________________   LABS (all labs ordered are listed, but only abnormal results are displayed)  Labs Reviewed  HEPATIC FUNCTION PANEL - Abnormal; Notable for the following:       Result Value    Indirect Bilirubin 1.1 (*)    All other components within normal limits  BASIC METABOLIC PANEL  CBC  TROPONIN I  LIPASE, BLOOD  TROPONIN I   ____________________________________________  EKG  I personally interpreted any EKGs ordered by me or triage Normal sinus rhythm rate 88 bpm no acute ST elevation or depression nonspecific ST changes ____________________________________________  RADIOLOGY  I reviewed any imaging ordered by me or triage that were performed during my shift and, if possible, patient and/or family made aware of any abnormal findings. ____________________________________________   PROCEDURES  Procedure(s) performed: None  Procedures  Critical Care performed: None  ____________________________________________   INITIAL IMPRESSION / ASSESSMENT AND PLAN / ED COURSE  Pertinent labs & imaging results that were available during my care of the patient were reviewed by me and considered in my medical decision making (see chart for details).  Patient with very reproducible chest wall pain for 2  weeks, troponin negative, abdomen benign.At this time, there does not appear to be clinical evidence to support the diagnosis of pulmonary embolus, dissection, myocarditis, endocarditis, pericarditis, pericardial tamponade, acute coronary syndrome, pneumothorax, pneumonia, or any other acute intrathoracic pathology that will require admission or acute intervention. Nor is there evidence of any significant intra-abdominal pathology causing this discomfort.  Clinical Course   ____________________________________________   FINAL CLINICAL IMPRESSION(S) / ED DIAGNOSES  Final diagnoses:  None      This chart was dictated using voice recognition software.  Despite best efforts to proofread,  errors can occur which can change meaning.      Jeanmarie PlantJames A Kellis Topete, MD 12/07/15 Ernestina [id number]

## 2015-12-07 NOTE — ED Triage Notes (Signed)
Says chest pain since yesterday.  Says it comes a goes

## 2016-06-04 ENCOUNTER — Emergency Department: Payer: Self-pay

## 2016-06-04 ENCOUNTER — Encounter: Payer: Self-pay | Admitting: Emergency Medicine

## 2016-06-04 DIAGNOSIS — Z87891 Personal history of nicotine dependence: Secondary | ICD-10-CM | POA: Insufficient documentation

## 2016-06-04 DIAGNOSIS — J45909 Unspecified asthma, uncomplicated: Secondary | ICD-10-CM | POA: Insufficient documentation

## 2016-06-04 DIAGNOSIS — R002 Palpitations: Secondary | ICD-10-CM | POA: Insufficient documentation

## 2016-06-04 LAB — CBC
HEMATOCRIT: 47.8 % (ref 40.0–52.0)
Hemoglobin: 16.4 g/dL (ref 13.0–18.0)
MCH: 29.7 pg (ref 26.0–34.0)
MCHC: 34.3 g/dL (ref 32.0–36.0)
MCV: 86.6 fL (ref 80.0–100.0)
PLATELETS: 203 10*3/uL (ref 150–440)
RBC: 5.52 MIL/uL (ref 4.40–5.90)
RDW: 12.8 % (ref 11.5–14.5)
WBC: 7 10*3/uL (ref 3.8–10.6)

## 2016-06-04 NOTE — ED Triage Notes (Signed)
Pt presents to ED with c/o intermittent "fluttering" in his chest that makes him cough. Has been ongoing for the past couple of weeks. Pain now radiating down his left arm. Pt states he also has a torn bicep in his left arm which could be contributing to his arm pain. Denies sob or nausea. Pt reports he is feeling very anxious about his current symptoms.

## 2016-06-05 ENCOUNTER — Emergency Department
Admission: EM | Admit: 2016-06-05 | Discharge: 2016-06-05 | Disposition: A | Discharge status: Home / Self Care | Payer: Self-pay | Attending: Emergency Medicine | Admitting: Emergency Medicine

## 2016-06-05 DIAGNOSIS — R002 Palpitations: Secondary | ICD-10-CM

## 2016-06-05 HISTORY — DX: Strain of muscle, fascia and tendon of other parts of biceps, unspecified arm, initial encounter: S46.219A

## 2016-06-05 LAB — BASIC METABOLIC PANEL
ANION GAP: 4 — AB (ref 5–15)
BUN: 19 mg/dL (ref 6–20)
CO2: 28 mmol/L (ref 22–32)
Calcium: 8.9 mg/dL (ref 8.9–10.3)
Chloride: 106 mmol/L (ref 101–111)
Creatinine, Ser: 1.09 mg/dL (ref 0.61–1.24)
GFR calc Af Amer: >60 mL/min (ref >60)
GFR calc non Af Amer: >60 mL/min (ref >60)
GLUCOSE: 113 mg/dL — AB (ref 65–99)
POTASSIUM: 3.5 mmol/L (ref 3.5–5.1)
Sodium: 138 mmol/L (ref 135–145)

## 2016-06-05 LAB — TROPONIN I: Troponin I: <0.03 ng/mL (ref <0.03)

## 2016-06-05 NOTE — Discharge Instructions (Signed)
As we discussed, your workup today was reassuring.  Though we do not know exactly what is causing your symptoms, it appears that you have no emergent medical condition at this time and that you are safe to go home and follow up as recommended in this paperwork.  We recommend you take a daily baby aspirin, at least until you follow up with cardiology for additional evaluation and treatment.  Please return immediately to the Emergency Department if you develop any new or worsening symptoms that concern you.

## 2016-06-05 NOTE — ED Provider Notes (Signed)
[facility] Emergency Department Provider Note  ____________________________________________   First MD Initiated Contact with Patient 06/05/16 [phone]     (approximate)  I have reviewed the triage vital signs and the nursing notes.   HISTORY  Chief Complaint Chest Pain    HPI Dean Pierce is a 46 y.o. male with a generally reassuring medical history who presents for evaluation of a fluttering sensation in his chest.  He reports that it has been going on for years but it is been more noticeable over the last few weeks.  It happens at least once a day and is very brief, lasting only a second or 2, but it is concerning to him.  He feels it right at the base of his sternum and he is not certain if it is his heart, his stomach, his esophagus, or his diaphragm.  It sometimes makes him cough or catch his breath.He denies chest pain and any other shortness of breath.  He reports that he has a torn bicep tendon and he is having some pain in his left arm throughout the left arm but it does not feel like it is radiating from the chest and he is not sure if it is related.  He describes symptoms as mild but concerning to him and persistent for a long period of time.  He does not have a history of high blood pressure, diabetes, high cholesterol, nor any first-degree relatives with a history of ACS/MI.  He quit smoking about 3 months ago.   Past Medical History:  Diagnosis Date  . Asthma   . Biceps muscle tear   . MRSA (methicillin resistant Staphylococcus aureus) carrier    Infection rt leg  . Sciatic nerve pain     There are no active problems to display for this patient.   Past Surgical History:  Procedure Laterality Date  . APPENDECTOMY    . COLON SURGERY    . FOOT SURGERY Right    Foot almost amputated  . KNEE ARTHROSCOPY W/ DEBRIDEMENT Right     Prior to Admission medications   Medication Sig Start Date End Date Taking? Authorizing Provider    HYDROcodone-acetaminophen (NORCO/VICODIN) 5-325 MG tablet Take 1 tablet by mouth every 4 (four) hours as needed (cough). 01/28/15   Burgess Amor, PA-C  ibuprofen (ADVIL,MOTRIN) 800 MG tablet Take 1 tablet (800 mg total) by mouth 3 (three) times daily. Patient not taking: Reported on 01/27/2015 10/13/14   Burgess Amor, PA-C  methocarbamol (ROBAXIN) 500 MG tablet Take 1 tablet (500 mg total) by mouth 3 (three) times daily. Patient not taking: Reported on 01/27/2015 11/09/14   Ivery Quale, PA-C  oxyCODONE-acetaminophen (PERCOCET/ROXICET) 5-325 MG per tablet Take 1 tablet by mouth every 4 (four) hours as needed. Patient not taking: Reported on 01/27/2015 10/13/14   Burgess Amor, PA-C  PE-Diphenhydramine-DM-GG-APAP (MUCINEX FAST-MAX DAY/NIGHT TAB PO) Take 2 tablets by mouth every 4 (four) hours as needed (for congestion).    Historical Provider, MD  predniSONE (DELTASONE) 10 MG tablet 6, 5, 4, 3, 2 then 1 tablet by mouth daily for 6 days total. 01/28/15   Burgess Amor, PA-C    Allergies Codeine; Morphine and related; and Latex  History reviewed. No pertinent family history.  Social History Social History  Substance Use Topics  . Smoking status: Former Smoker    Packs/day: 0.00    Types: Cigarettes  . Smokeless tobacco: Never Used  . Alcohol use No     Comment: occassionally  Review of Systems Constitutional: No fever/chills Eyes: No visual changes. ENT: No sore throat. Cardiovascular: Denies chest pain. Daily palpitations. Respiratory: Denies shortness of breath. Gastrointestinal: No abdominal pain.  No nausea, no vomiting.  No diarrhea.  No constipation. Genitourinary: Negative for dysuria. Musculoskeletal: Negative for back pain. Skin: Negative for rash. Neurological: Negative for headaches, focal weakness or numbness.  10-point ROS otherwise negative.  ____________________________________________   PHYSICAL EXAM:  VITAL SIGNS: ED Triage Vitals  Enc Vitals Group     BP  06/04/16 2346 (!) 145/96     Pulse Rate 06/04/16 2346 84     Resp 06/04/16 2346 18     Temp 06/04/16 2346 97.9 F (36.6 C)     Temp Source 06/04/16 2346 Oral     SpO2 06/04/16 2346 99 %     Weight 06/04/16 2342 245 lb (111.1 kg)     Height 06/04/16 2342 6' (1.829 m)     Head Circumference --      Peak Flow --      Pain Score 06/04/16 2342 6     Pain Loc --      Pain Edu? --      Excl. in GC? --     Constitutional: Alert and oriented. Well appearing and in no acute distress. Eyes: Conjunctivae are normal. PERRL. EOMI. Head: Atraumatic. Nose: No congestion/rhinnorhea. Mouth/Throat: Mucous membranes are moist. Neck: No stridor.  No meningeal signs.   Cardiovascular: Normal rate, regular rhythm. Good peripheral circulation. Grossly normal heart sounds. Respiratory: Normal respiratory effort.  No retractions. Lungs CTAB. Gastrointestinal: Soft and nontender. No distention.  Musculoskeletal: No lower extremity tenderness nor edema. No gross deformities of extremities. Neurologic:  Normal speech and language. No gross focal neurologic deficits are appreciated.  Skin:  Skin is warm, dry and intact. No rash noted. Psychiatric: Mood and affect are normal. Speech and behavior are normal.  ____________________________________________   LABS (all labs ordered are listed, but only abnormal results are displayed)  Labs Reviewed  BASIC METABOLIC PANEL - Abnormal; Notable for the following:       Result Value   Glucose, Bld 113 (*)    Anion gap 4 (*)    All other components within normal limits  CBC  TROPONIN I   ____________________________________________  EKG  ED ECG REPORT I, Angelena Sand, the attending physician, personally viewed and interpreted this ECG.  Date: 06/04/2016 EKG Time: 23:42 Rate: 74 Rhythm: normal sinus rhythm QRS Axis: normal Intervals: normal ST/T Wave abnormalities: normal Conduction Disturbances: none Narrative Interpretation:  unremarkable  ____________________________________________  RADIOLOGY   Dg Chest 2 View  Result Date: 06/05/2016 CLINICAL DATA:  Midchest pain and left upper extremity discomfort for 2 weeks. EXAM: CHEST  2 VIEW COMPARISON:  12/07/2015 FINDINGS: The lungs are clear. The pulmonary vasculature is normal. Heart size is normal. Hilar and mediastinal contours are unremarkable. There is no pleural effusion. IMPRESSION: No active cardiopulmonary disease. Electronically Signed   By: Ellery Plunk M.D.   On: 06/05/2016 00:03    ____________________________________________   PROCEDURES  Critical Care performed: No   Procedure(s) performed:   Procedures   ____________________________________________   INITIAL IMPRESSION / ASSESSMENT AND PLAN / ED COURSE  Pertinent labs & imaging results that were available during my care of the patient were reviewed by me and considered in my medical decision making (see chart for details).  The patient is having some palpitations which may represent PACs or PVCs or even SVT, but it is very brief  and nonsustained.  He has a HEART score of 2 which is low risk.  He may benefit from further cardiovascular workup but he does not require admission at this time.  I explained to them my thought process including how this may be esophageal spasms or even acid reflux as well and I encouraged him to try PPI.  I gave him the name and number of the patient navigate her to sit up with PCP as well as with a local cardiologist with whom he can follow-up.  Given the duration of symptoms is no indication for a repeat troponin.  I gave my usual and customary return precautions.         ____________________________________________  FINAL CLINICAL IMPRESSION(S) / ED DIAGNOSES  Final diagnoses:  Palpitations     MEDICATIONS GIVEN DURING THIS VISIT:  Medications - No data to display   NEW OUTPATIENT MEDICATIONS STARTED DURING THIS VISIT:  New Prescriptions    No medications on file    Modified Medications   No medications on file    Discontinued Medications   No medications on file     Note:  This document was prepared using Dragon voice recognition software and may include unintentional dictation errors.    Loleta Rose, MD 06/05/16 0200

## 2016-08-19 ENCOUNTER — Encounter: Payer: Self-pay | Admitting: Emergency Medicine

## 2016-08-19 ENCOUNTER — Emergency Department
Admission: EM | Admit: 2016-08-19 | Discharge: 2016-08-19 | Disposition: A | Discharge status: Home / Self Care | Payer: Self-pay | Attending: Emergency Medicine | Admitting: Emergency Medicine

## 2016-08-19 DIAGNOSIS — F17210 Nicotine dependence, cigarettes, uncomplicated: Secondary | ICD-10-CM | POA: Insufficient documentation

## 2016-08-19 DIAGNOSIS — Z91040 Latex allergy status: Secondary | ICD-10-CM | POA: Insufficient documentation

## 2016-08-19 DIAGNOSIS — J45909 Unspecified asthma, uncomplicated: Secondary | ICD-10-CM | POA: Insufficient documentation

## 2016-08-19 DIAGNOSIS — J0100 Acute maxillary sinusitis, unspecified: Secondary | ICD-10-CM | POA: Insufficient documentation

## 2016-08-19 MED ORDER — CETIRIZINE HCL 5 MG/5ML PO SOLN
5.0000 mg | Freq: Once | ORAL | Status: AC
  Administered 2016-08-19: 5 mg via ORAL
  Filled 2016-08-19: qty 5

## 2016-08-19 MED ORDER — DIPHENHYDRAMINE HCL 25 MG PO CAPS: 25.0000 mg | ORAL_CAPSULE | ORAL | 0 refills | Status: DC | PRN

## 2016-08-19 MED ORDER — METHYLPREDNISOLONE SODIUM SUCC 125 MG IJ SOLR
125.0000 mg | Freq: Once | INTRAMUSCULAR | Status: AC
  Administered 2016-08-19: 125 mg via INTRAMUSCULAR
  Filled 2016-08-19: qty 2

## 2016-08-19 MED ORDER — FLUTICASONE PROPIONATE 50 MCG/ACT NA SUSP: 2.0000 | Freq: Every day | NASAL | 0 refills | Status: DC

## 2016-08-19 NOTE — ED Notes (Signed)
Pt states sinus pressure since Thursday. Points to underneath eyes, forehead, and top of head. Denies coughing anything. Denies fever. Pt appears congested.

## 2016-08-19 NOTE — ED Provider Notes (Signed)
[facility] Emergency Department Provider Note  ____________________________________________  Time seen: Approximately 7:48 PM  I have reviewed the triage vital signs and the nursing notes.   HISTORY  Chief Complaint Nasal Congestion    HPI Dean Pierce is a 46 y.o. male that presents to emergency department with 5 days of nasal congestion. Patient states that he was planting a garden at work when he inhaled a bunch of peat moss. The next morning he blew green moss out of his nose. His allergies have been bothering him since. He states there is a lot of pressure in his cheeks and forehead. He is sneezing but unable to blow anything out of his nose. He has taken Mucinex for symptoms, which has not helped. When he takes oral steroids, he gets irritable. He denies fever, shortness breath, cough, chest pain, nausea, vomiting, abdominal pain.   Past Medical History:  Diagnosis Date  . Asthma   . Biceps muscle tear   . MRSA (methicillin resistant Staphylococcus aureus) carrier    Infection rt leg  . Sciatic nerve pain     There are no active problems to display for this patient.   Past Surgical History:  Procedure Laterality Date  . APPENDECTOMY    . COLON SURGERY    . FOOT SURGERY Right    Foot almost amputated  . KNEE ARTHROSCOPY W/ DEBRIDEMENT Right     Prior to Admission medications   Medication Sig Start Date End Date Taking? Authorizing Provider  diphenhydrAMINE (BENADRYL) 25 mg capsule Take 1 capsule (25 mg total) by mouth every 4 (four) hours as needed. 08/19/16 08/19/17  Enid Derry, PA-C  fluticasone (FLONASE) 50 MCG/ACT nasal spray Place 2 sprays into both nostrils daily. 08/19/16 08/19/17  Enid Derry, PA-C  HYDROcodone-acetaminophen (NORCO/VICODIN) 5-325 MG tablet Take 1 tablet by mouth every 4 (four) hours as needed (cough). 01/28/15   Burgess Amor, PA-C  ibuprofen (ADVIL,MOTRIN) 800 MG tablet Take 1 tablet (800 mg total) by mouth 3  (three) times daily. Patient not taking: Reported on 01/27/2015 10/13/14   Burgess Amor, PA-C  methocarbamol (ROBAXIN) 500 MG tablet Take 1 tablet (500 mg total) by mouth 3 (three) times daily. Patient not taking: Reported on 01/27/2015 11/09/14   Ivery Quale, PA-C  oxyCODONE-acetaminophen (PERCOCET/ROXICET) 5-325 MG per tablet Take 1 tablet by mouth every 4 (four) hours as needed. Patient not taking: Reported on 01/27/2015 10/13/14   Burgess Amor, PA-C  PE-Diphenhydramine-DM-GG-APAP (MUCINEX FAST-MAX DAY/NIGHT TAB PO) Take 2 tablets by mouth every 4 (four) hours as needed (for congestion).    [provider]  predniSONE (DELTASONE) 10 MG tablet 6, 5, 4, 3, 2 then 1 tablet by mouth daily for 6 days total. 01/28/15   Idol, Raynelle Fanning, PA-C    Allergies Codeine; Morphine and related; and Latex  No family history on file.  Social History Social History  Substance Use Topics  . Smoking status: Current Every Day Smoker    Packs/day: 0.50    Types: Cigarettes  . Smokeless tobacco: Never Used  . Alcohol use No     Comment: occassionally     Review of Systems  Constitutional: No fever/chills Cardiovascular: No chest pain. Respiratory: No cough. No SOB. Gastrointestinal: No abdominal pain.  No nausea, no vomiting.  Musculoskeletal: Negative for musculoskeletal pain. Skin: Negative for rash, abrasions, lacerations, ecchymosis. Neurological: Negative for headaches, numbness or tingling   ____________________________________________   PHYSICAL EXAM:  VITAL SIGNS: ED Triage Vitals  Enc Vitals Group  BP 08/19/16 1853 129/89     Pulse Rate 08/19/16 1853 97     Resp 08/19/16 1853 20     Temp 08/19/16 1853 99.2 F (37.3 C)     Temp Source 08/19/16 1853 Oral     SpO2 08/19/16 1853 98 %     Weight 08/19/16 1853 245 lb (111.1 kg)     Height 08/19/16 1853 6' (1.829 m)     Head Circumference --      Peak Flow --      Pain Score 08/19/16 1908 8     Pain Loc --      Pain Edu? --       Excl. in GC? --      Constitutional: Alert and oriented. Well appearing and in no acute distress. Eyes: Conjunctivae are normal. PERRL. EOMI. Head: Atraumatic. ENT: Maxillary sinus tenderness.      Ears: Tympanic membranes pearly gray bilaterally.      Nose: No congestion/rhinnorhea.       Mouth/Throat: Mucous membranes are moist.  Neck: No stridor. Cardiovascular: Normal rate, regular rhythm.  Good peripheral circulation. Respiratory: Normal respiratory effort without tachypnea or retractions. Lungs CTAB. Good air entry to the bases with no decreased or absent breath sounds. Musculoskeletal: Full range of motion to all extremities. No gross deformities appreciated. Neurologic:  Normal speech and language. No gross focal neurologic deficits are appreciated.  Skin:  Skin is warm, dry and intact. No rash noted. Psychiatric: Mood and affect are normal. Speech and behavior are normal. Patient exhibits appropriate insight and judgement.   ____________________________________________   LABS (all labs ordered are listed, but only abnormal results are displayed)  Labs Reviewed - No data to display ____________________________________________  EKG   ____________________________________________  RADIOLOGY  No results found.  ____________________________________________    PROCEDURES  Procedure(s) performed:    Procedures    Medications  methylPREDNISolone sodium succinate (SOLU-MEDROL) 125 mg/2 mL injection 125 mg (125 mg Intramuscular Given 08/19/16 2028)  cetirizine HCl (Zyrtec) 5 MG/5ML solution 5 mg (5 mg Oral Given 08/19/16 2027)     ____________________________________________   INITIAL IMPRESSION / ASSESSMENT AND PLAN / ED COURSE  Pertinent labs & imaging results that were available during my care of the patient were reviewed by me and considered in my medical decision making (see chart for details).  Review of the [place] CSRS was performed in accordance of  the [employer] prior to dispensing any controlled drugs.   Patient's diagnosis is consistent with sinusitis. Vital signs and exam are reassuring. We discussed antibiotics and patient would like to hold off on antibiotics at this time. He was given Solu-Medrol and zyrtec in ED. He does not want to do oral steroids. Patient is to follow up with PCP as directed. Patient is given ED precautions to return to the ED for any worsening or new symptoms.     ____________________________________________  FINAL CLINICAL IMPRESSION(S) / ED DIAGNOSES  Final diagnoses:  Acute maxillary sinusitis, recurrence not specified      NEW MEDICATIONS STARTED DURING THIS VISIT:  Discharge Medication List as of 08/19/2016  8:19 PM    START taking these medications   Details  diphenhydrAMINE (BENADRYL) 25 mg capsule Take 1 capsule (25 mg total) by mouth every 4 (four) hours as needed., Starting Tue 08/19/2016, Until Wed 08/19/2017, Print    fluticasone (FLONASE) 50 MCG/ACT nasal spray Place 2 sprays into both nostrils daily., Starting Tue 08/19/2016, Until Wed 08/19/2017, Print  This chart was dictated using voice recognition software/Dragon. Despite best efforts to proofread, errors can occur which can change the meaning. Any change was purely unintentional.    Enid Derry, PA-C 08/19/16 2049    Pershing Proud Myra Rude, MD 08/19/16 [phone]

## 2016-08-19 NOTE — ED Triage Notes (Signed)
Patient ambulatory to triage with steady gait, without difficulty or distress noted  Pt reports sinus pressure/drainage since Thursday evening after working in [employer]

## 2016-11-11 ENCOUNTER — Ambulatory Visit
Admission: EM | Admit: 2016-11-11 | Discharge: 2016-11-11 | Disposition: A | Discharge status: Home / Self Care | Payer: Self-pay | Attending: Family Medicine | Admitting: Family Medicine

## 2016-11-11 DIAGNOSIS — J069 Acute upper respiratory infection, unspecified: Secondary | ICD-10-CM

## 2016-11-11 MED ORDER — ALBUTEROL SULFATE HFA 108 (90 BASE) MCG/ACT IN AERS: 1.0000 | INHALATION_SPRAY | Freq: Four times a day (QID) | RESPIRATORY_TRACT | 0 refills | Status: DC | PRN

## 2016-11-11 MED ORDER — HYDROCOD POLST-CPM POLST ER 10-8 MG/5ML PO SUER: 5.0000 mL | Freq: Two times a day (BID) | ORAL | 0 refills | Status: DC

## 2016-11-11 MED ORDER — BENZONATATE 200 MG PO CAPS: ORAL_CAPSULE | ORAL | 0 refills | Status: DC

## 2016-11-11 NOTE — ED Triage Notes (Signed)
Patient complains of chest congestion, nasal congestion, sinus pain and pressure, cough and headaches that started on Saturday.

## 2016-11-11 NOTE — ED Provider Notes (Signed)
[facility]    CSN: [id number] Arrival date & time: 11/11/16  1106     History   Chief Complaint Chief Complaint  Patient presents with  . Cough    HPI Dean Pierce is a 46 y.o. male.   HPI  Is a 46 year old male who presents with history of chest congestion, nasal congestion and rhinorrhea, sinus pain and pressure that has gone into his chest creating a cough particularly at nighttime causing him to sleep in a semi-reclining position. He states that he is coughing up gray mucousy type of sputum. His denies any fever or chills. He smokes about a half a pack a day, down from 2 packs per day that he was smoking a while back. He states several of his  coworkers have had similar symptoms.       Past Medical History:  Diagnosis Date  . Asthma   . Biceps muscle tear   . MRSA (methicillin resistant Staphylococcus aureus) carrier    Infection rt leg  . Sciatic nerve pain     There are no active problems to display for this patient.   Past Surgical History:  Procedure Laterality Date  . APPENDECTOMY    . COLON SURGERY    . FOOT SURGERY Right    Foot almost amputated  . KNEE ARTHROSCOPY W/ DEBRIDEMENT Right        Home Medications    Prior to Admission medications   Medication Sig Start Date End Date Taking? Authorizing Provider  fluticasone (FLONASE) 50 MCG/ACT nasal spray Place 2 sprays into both nostrils daily. 08/19/16 08/19/17 Yes Enid DerryWagner, Ashley, PA-C  ibuprofen (ADVIL,MOTRIN) 800 MG tablet Take 1 tablet (800 mg total) by mouth 3 (three) times daily. 10/13/14  Yes Idol, Raynelle FanningJulie, PA-C  PE-Diphenhydramine-DM-GG-APAP (MUCINEX FAST-MAX DAY/NIGHT TAB PO) Take 2 tablets by mouth every 4 (four) hours as needed (for congestion).   Yes [provider]  albuterol (PROVENTIL HFA;VENTOLIN HFA) 108 (90 Base) MCG/ACT inhaler Inhale 1-2 puffs into the lungs every 6 (six) hours as needed for wheezing or shortness of breath. Use with spacer 11/11/16   Lutricia Feiloemer,  Jonee Lamore P, PA-C  benzonatate (TESSALON) 200 MG capsule Take one cap TID PRN cough 11/11/16   Lutricia Feiloemer, Quoc Tome P, PA-C  chlorpheniramine-HYDROcodone [facility](TUSSIONEX PENNKINETIC ER) 10-8 MG/5ML SUER Take 5 mLs by mouth 2 (two) times daily. 11/11/16   Lutricia Feiloemer, Malya Cirillo P, PA-C  diphenhydrAMINE (BENADRYL) 25 mg capsule Take 1 capsule (25 mg total) by mouth every 4 (four) hours as needed. 08/19/16 08/19/17  Enid DerryWagner, Ashley, PA-C  HYDROcodone-acetaminophen (NORCO/VICODIN) 5-325 MG tablet Take 1 tablet by mouth every 4 (four) hours as needed (cough). 01/28/15   Burgess AmorIdol, Julie, PA-C  methocarbamol (ROBAXIN) 500 MG tablet Take 1 tablet (500 mg total) by mouth 3 (three) times daily. Patient not taking: Reported on 01/27/2015 11/09/14   Ivery QualeBryant, Hobson, PA-C  oxyCODONE-acetaminophen (PERCOCET/ROXICET) 5-325 MG per tablet Take 1 tablet by mouth every 4 (four) hours as needed. Patient not taking: Reported on 01/27/2015 10/13/14   Burgess AmorIdol, Julie, PA-C  predniSONE (DELTASONE) 10 MG tablet 6, 5, 4, 3, 2 then 1 tablet by mouth daily for 6 days total. 01/28/15   Burgess AmorIdol, Julie, PA-C    Family History History reviewed. No pertinent family history.  Social History Social History  Substance Use Topics  . Smoking status: Current Every Day Smoker    Packs/day: 0.50    Types: Cigarettes  . Smokeless tobacco: Never Used  . Alcohol use No  Comment: occassionally     Allergies   Codeine; Morphine and related; and Latex   Review of Systems Review of Systems  Constitutional: Positive for activity change. Negative for appetite change, chills, fatigue and fever.  HENT: Positive for congestion, ear pain, nosebleeds, postnasal drip, rhinorrhea, sinus pain and sinus pressure.   Respiratory: Positive for cough and shortness of breath. Negative for wheezing and stridor.   All other systems reviewed and are negative.    Physical Exam Triage Vital Signs ED Triage Vitals  Enc Vitals Group     BP 11/11/16 1122 126/85     Pulse Rate  11/11/16 1122 88     Resp 11/11/16 1122 17     Temp 11/11/16 1122 98.5 F (36.9 C)     Temp Source 11/11/16 1122 Oral     SpO2 11/11/16 1122 96 %     Weight 11/11/16 1121 248 lb (112.5 kg)     Height 11/11/16 1121 6' (1.829 m)     Head Circumference --      Peak Flow --      Pain Score 11/11/16 1121 8     Pain Loc --      Pain Edu? --      Excl. in GC? --    No data found.   Updated Vital Signs BP 126/85 (BP Location: Right Arm)   Pulse 88   Temp 98.5 F (36.9 C) (Oral)   Resp 17   Ht 6' (1.829 m)   Wt 248 lb (112.5 kg)   SpO2 96%   BMI 33.63 kg/m   Visual Acuity Right Eye Distance:   Left Eye Distance:   Bilateral Distance:    Right Eye Near:   Left Eye Near:    Bilateral Near:     Physical Exam  Constitutional: He is oriented to person, place, and time. He appears well-developed and well-nourished. No distress.  HENT:  Head: Normocephalic.  Right Ear: External ear normal.  Left Ear: External ear normal.  Nose: Nose normal.  Mouth/Throat: Oropharynx is clear and moist. No oropharyngeal exudate.  Eyes: Pupils are equal, round, and reactive to light. Right eye exhibits no discharge. Left eye exhibits no discharge.  Neck: Normal range of motion.  Pulmonary/Chest: Effort normal and breath sounds normal. No respiratory distress. He has no wheezes. He has no rales.  Musculoskeletal: Normal range of motion.  Lymphadenopathy:    He has no cervical adenopathy.  Neurological: He is alert and oriented to person, place, and time.  Skin: Skin is warm and dry. He is not diaphoretic.  Psychiatric: He has a normal mood and affect. His behavior is normal. Judgment and thought content normal.  Nursing note and vitals reviewed.    UC Treatments / Results  Labs (all labs ordered are listed, but only abnormal results are displayed) Labs Reviewed - No data to display  EKG  EKG Interpretation None       Radiology No results found.  Procedures Procedures  (including critical care time)  Medications Ordered in UC Medications - No data to display   Initial Impression / Assessment and Plan / UC Course  I have reviewed the triage vital signs and the nursing notes.  Pertinent labs & imaging results that were available during my care of the patient were reviewed by me and considered in my medical decision making (see chart for details).     Plan: 1. Test/x-ray results and diagnosis reviewed with patient 2. rx as per orders; risks,  benefits, potential side effects reviewed with patient 3. Recommend supportive treatment with use of an albuterol inhaler for shortness of breath. She was encouraged to stop smoking. He is also cautioned regarding use of Tussionex for activities requiring concentration and judgment and not driving while taking the medication. Use the Tessalon Perles during the daytime. He is not improving he should follow-up with his primary care physician or may return to our clinic. 4. F/u prn if symptoms worsen or don't improve   Final Clinical Impressions(s) / UC Diagnoses   Final diagnoses:  Upper respiratory tract infection, unspecified type    New Prescriptions Discharge Medication List as of 11/11/2016 12:19 PM    START taking these medications   Details  albuterol (PROVENTIL HFA;VENTOLIN HFA) 108 (90 Base) MCG/ACT inhaler Inhale 1-2 puffs into the lungs every 6 (six) hours as needed for wheezing or shortness of breath. Use with spacer, Starting Tue 11/11/2016, Normal    benzonatate (TESSALON) 200 MG capsule Take one cap TID PRN cough, Normal    chlorpheniramine-HYDROcodone (TUSSIONEX PENNKINETIC ER) 10-8 MG/5ML SUER Take 5 mLs by mouth 2 (two) times daily., Starting Tue 11/11/2016, Print         Controlled Substance Prescriptions [place] Controlled Substance Registry consulted? Not Applicable   Lutricia Feil, PA-C 11/11/16 1238

## 2016-12-22 ENCOUNTER — Ambulatory Visit
Admission: RE | Admit: 2016-12-22 | Discharge: 2016-12-22 | Disposition: A | Discharge status: Home / Self Care | Payer: Worker's Compensation | Source: Ambulatory Visit | Attending: Physician Assistant | Admitting: Physician Assistant

## 2016-12-22 ENCOUNTER — Other Ambulatory Visit: Payer: Self-pay | Admitting: Physician Assistant

## 2016-12-22 DIAGNOSIS — M79605 Pain in left leg: Secondary | ICD-10-CM | POA: Diagnosis not present

## 2016-12-22 DIAGNOSIS — M79662 Pain in left lower leg: Secondary | ICD-10-CM

## 2017-04-03 ENCOUNTER — Other Ambulatory Visit: Payer: Self-pay

## 2017-04-03 ENCOUNTER — Encounter: Payer: Self-pay | Admitting: Emergency Medicine

## 2017-04-03 ENCOUNTER — Emergency Department
Admission: EM | Admit: 2017-04-03 | Discharge: 2017-04-03 | Disposition: A | Discharge status: Home / Self Care | Payer: Self-pay | Attending: Emergency Medicine | Admitting: Emergency Medicine

## 2017-04-03 DIAGNOSIS — J45909 Unspecified asthma, uncomplicated: Secondary | ICD-10-CM | POA: Insufficient documentation

## 2017-04-03 DIAGNOSIS — F17210 Nicotine dependence, cigarettes, uncomplicated: Secondary | ICD-10-CM | POA: Insufficient documentation

## 2017-04-03 DIAGNOSIS — Z79899 Other long term (current) drug therapy: Secondary | ICD-10-CM | POA: Insufficient documentation

## 2017-04-03 DIAGNOSIS — J0100 Acute maxillary sinusitis, unspecified: Secondary | ICD-10-CM

## 2017-04-03 MED ORDER — METHYLPREDNISOLONE 4 MG PO TBPK: ORAL_TABLET | ORAL | 0 refills | Status: DC

## 2017-04-03 MED ORDER — DEXAMETHASONE SODIUM PHOSPHATE 10 MG/ML IJ SOLN
10.0000 mg | Freq: Once | INTRAMUSCULAR | Status: AC
  Administered 2017-04-03: 10 mg via INTRAMUSCULAR
  Filled 2017-04-03: qty 1

## 2017-04-03 MED ORDER — FEXOFENADINE-PSEUDOEPHED ER 60-120 MG PO TB12: 1.0000 | ORAL_TABLET | Freq: Two times a day (BID) | ORAL | 0 refills | Status: DC

## 2017-04-03 MED ORDER — AMOXICILLIN 500 MG PO CAPS: 500.0000 mg | ORAL_CAPSULE | Freq: Three times a day (TID) | ORAL | 0 refills | Status: DC

## 2017-04-03 NOTE — ED Provider Notes (Signed)
[facility] Emergency Department Provider Note   ____________________________________________   First MD Initiated Contact with Patient 04/03/17 1419     (approximate)  I have reviewed the triage vital signs and the nursing notes.   HISTORY  Chief Complaint Facial Pain    HPI Dean Pierce is a 47 y.o. male patient complaining of increased sinus pressure and facial pain for 7-10 days.  Patient also complained of headache secondary to congestion.  Patient denies fever associated with this complaint.  Patient states his bilateral ear pressure.  Patient has a cough secondary to postnasal drainage.  Patient denies nausea, vomiting, diarrhea.  Patient denies taking flu shot for this season.  Patient rates his pain as a 7/10.  Patient described the pain as "pressure".  Patient taking over-the-counter Mucinex for relief.   Past Medical History:  Diagnosis Date  . Asthma   . Biceps muscle tear   . MRSA (methicillin resistant Staphylococcus aureus) carrier    Infection rt leg  . Sciatic nerve pain     There are no active problems to display for this patient.   Past Surgical History:  Procedure Laterality Date  . APPENDECTOMY    . COLON SURGERY    . FOOT SURGERY Right    Foot almost amputated  . KNEE ARTHROSCOPY W/ DEBRIDEMENT Right     Prior to Admission medications   Medication Sig Start Date End Date Taking? Authorizing Provider  albuterol (PROVENTIL HFA;VENTOLIN HFA) 108 (90 Base) MCG/ACT inhaler Inhale 1-2 puffs into the lungs every 6 (six) hours as needed for wheezing or shortness of breath. Use with spacer 11/11/16   Lutricia Feil, PA-C  amoxicillin (AMOXIL) 500 MG capsule Take 1 capsule (500 mg total) by mouth 3 (three) times daily. 04/03/17   Joni Reining, PA-C  benzonatate (TESSALON) 200 MG capsule Take one cap TID PRN cough 11/11/16   Lutricia Feil, PA-C  chlorpheniramine-HYDROcodone [facility] ER) 10-8 MG/5ML SUER Take  5 mLs by mouth 2 (two) times daily. 11/11/16   Lutricia Feil, PA-C  diphenhydrAMINE (BENADRYL) 25 mg capsule Take 1 capsule (25 mg total) by mouth every 4 (four) hours as needed. 08/19/16 08/19/17  Enid Derry, PA-C  fexofenadine-pseudoephedrine (ALLEGRA-D) 60-120 MG 12 hr tablet Take 1 tablet by mouth 2 (two) times daily. 04/03/17   Joni Reining, PA-C  fluticasone (FLONASE) 50 MCG/ACT nasal spray Place 2 sprays into both nostrils daily. 08/19/16 08/19/17  Enid Derry, PA-C  HYDROcodone-acetaminophen (NORCO/VICODIN) 5-325 MG tablet Take 1 tablet by mouth every 4 (four) hours as needed (cough). 01/28/15   Burgess Amor, PA-C  ibuprofen (ADVIL,MOTRIN) 800 MG tablet Take 1 tablet (800 mg total) by mouth 3 (three) times daily. 10/13/14   Burgess Amor, PA-C  methocarbamol (ROBAXIN) 500 MG tablet Take 1 tablet (500 mg total) by mouth 3 (three) times daily. Patient not taking: Reported on 01/27/2015 11/09/14   Ivery Quale, PA-C  methylPREDNISolone (MEDROL DOSEPAK) 4 MG TBPK tablet Take Tapered dose as directed 04/03/17   Joni Reining, PA-C  oxyCODONE-acetaminophen (PERCOCET/ROXICET) 5-325 MG per tablet Take 1 tablet by mouth every 4 (four) hours as needed. Patient not taking: Reported on 01/27/2015 10/13/14   Burgess Amor, PA-C  PE-Diphenhydramine-DM-GG-APAP (MUCINEX FAST-MAX DAY/NIGHT TAB PO) Take 2 tablets by mouth every 4 (four) hours as needed (for congestion).    [provider]  predniSONE (DELTASONE) 10 MG tablet 6, 5, 4, 3, 2 then 1 tablet by mouth daily for 6 days  total. 01/28/15   Burgess AmorIdol, Julie, PA-C    Allergies Codeine; Morphine and related; and Latex  No family history on file.  Social History Social History   Tobacco Use  . Smoking status: Current Every Day Smoker    Packs/day: 0.50    Types: Cigarettes  . Smokeless tobacco: Never Used  Substance Use Topics  . Alcohol use: No    Comment: occassionally  . Drug use: No    Review of Systems  Constitutional: No  fever/chills Eyes: No visual changes. ENT: No sore throat.  Nasal and sinus pressure. Cardiovascular: Denies chest pain. Respiratory: Denies shortness of breath.  Cough Gastrointestinal: No abdominal pain.  No nausea, no vomiting.  No diarrhea.  No constipation. Genitourinary: Negative for dysuria. Musculoskeletal: Negative for back pain. Skin: Negative for rash. Neurological: Negative for headaches, focal weakness or numbness. Allergic/Immunilogical:  See Medication list ____________________________________________   PHYSICAL EXAM:  VITAL SIGNS: ED Triage Vitals [04/03/17 1414]  Enc Vitals Group     BP (!) 143/89     Pulse Rate 88     Resp 20     Temp 98.3 F (36.8 C)     Temp Source Oral     SpO2 98 %     Weight 230 lb (104.3 kg)     Height 6' (1.829 m)     Head Circumference      Peak Flow      Pain Score      Pain Loc      Pain Edu?      Excl. in GC?     Constitutional: Alert and oriented. Well appearing and in no acute distress. Nose: Edematous nasal turbinates with bilateral maxillary guarding.  Mouth/Throat: Mucous membranes are moist.  Oropharynx non-erythematous.  Postnasal drainage Neck: No stridor.  Hematological/Lymphatic/Immunilogical: No cervical lymphadenopathy. Cardiovascular: Normal rate, regular rhythm. Grossly normal heart sounds.  Good peripheral circulation. Respiratory: Normal respiratory effort.  No retractions. Lungs CTAB. Skin:  Skin is warm, dry and intact. No rash noted. Psychiatric: Mood and affect are normal. Speech and behavior are normal.  ____________________________________________   LABS (all labs ordered are listed, but only abnormal results are displayed)  Labs Reviewed - No data to display ____________________________________________  EKG   ____________________________________________  RADIOLOGY  ED MD interpretation:     ____________________________________________   PROCEDURES  Procedure(s) performed:  None  Procedures  Critical Care performed: No  ____________________________________________   INITIAL IMPRESSION / ASSESSMENT AND PLAN / ED COURSE  As part of my medical decision making, I reviewed the following data within the electronic MEDICAL RECORD NUMBER    Acute maxillary sinusitis.  Patient given discharge care instruction.  Patient advised take medication as directed.  Patient given work note advised [facility].      ____________________________________________   FINAL CLINICAL IMPRESSION(S) / ED DIAGNOSES  Final diagnoses:  Acute maxillary sinusitis, recurrence not specified     ED Discharge Orders        Ordered    amoxicillin (AMOXIL) 500 MG capsule  3 times daily     04/03/17 1429    fexofenadine-pseudoephedrine (ALLEGRA-D) 60-120 MG 12 hr tablet  2 times daily     04/03/17 1429    methylPREDNISolone (MEDROL DOSEPAK) 4 MG TBPK tablet     04/03/17 1429       Note:  This document was prepared using Dragon voice recognition software and may include unintentional dictation errors.    Katrinka BlazingSmith,  Arther Abbott, PA-C 04/03/17 1435    Schaevitz, Myra Rude, MD 04/03/17 [phone]

## 2017-04-03 NOTE — ED Triage Notes (Signed)
Presents with sinus pressure and facial pain  No fever

## 2017-04-03 NOTE — Discharge Instructions (Signed)
May continue over-the-counter Mucinex for cough.

## 2017-06-16 ENCOUNTER — Ambulatory Visit
Admission: EM | Admit: 2017-06-16 | Discharge: 2017-06-16 | Disposition: A | Discharge status: Home / Self Care | Payer: Self-pay | Attending: Family Medicine | Admitting: Family Medicine

## 2017-06-16 ENCOUNTER — Other Ambulatory Visit: Payer: Self-pay

## 2017-06-16 DIAGNOSIS — J0100 Acute maxillary sinusitis, unspecified: Secondary | ICD-10-CM

## 2017-06-16 DIAGNOSIS — H6591 Unspecified nonsuppurative otitis media, right ear: Secondary | ICD-10-CM

## 2017-06-16 DIAGNOSIS — J309 Allergic rhinitis, unspecified: Secondary | ICD-10-CM

## 2017-06-16 DIAGNOSIS — H5789 Other specified disorders of eye and adnexa: Secondary | ICD-10-CM

## 2017-06-16 MED ORDER — AMOXICILLIN-POT CLAVULANATE 875-125 MG PO TABS: 1.0000 | ORAL_TABLET | Freq: Two times a day (BID) | ORAL | 0 refills | Status: DC

## 2017-06-16 MED ORDER — ERYTHROMYCIN 5 MG/GM OP OINT: 1.0000 "application " | TOPICAL_OINTMENT | Freq: Four times a day (QID) | OPHTHALMIC | 0 refills | Status: DC

## 2017-06-16 NOTE — ED Triage Notes (Signed)
X 3-4 days. Also c/o right eye redness and discharge with pain.

## 2017-06-16 NOTE — Discharge Instructions (Signed)
Take medication as prescribed. Rest. Drink plenty of fluids.  ° °Follow up with your primary care physician this week as needed. Return to Urgent care for new or worsening concerns.  ° °

## 2017-06-16 NOTE — ED Provider Notes (Signed)
[facility] ____________________________________________  Time seen: Approximately 9:09 PM  I have reviewed the triage vital signs and the nursing notes.   HISTORY  Chief Complaint Nasal Congestion  HPI Dean Pierce is a 47 y.o. male with a history of seasonal allergies, and asthma presenting for evaluation of 4 days of increased nasal congestion, nasal drainage, intermittent bilateral ear discomfort and eye complaints.  Patient reports he has had some nasal drainage and congestion with intermittent sinus pressure for the last few weeks that have acutely worsened over the last few days with frontal and maxillary sinus pressure.  States clear to thick drainage, intermittently.  Has not been trying over-the-counter medication persistently.  Patient reports this past weekend he was having more sneezing and congestion.  States that he began having some right eye watering and then Sunday morning when he woke up he had sensation of right medial eyelid tenderness and swelling.  States swelling has continued to gradually increase as well as noticed some intermittent redness.  Denies foreign body sensation, suspected foreign body or abrupt onset. Denies chemical exposure. Denies vision loss or vision changes.  Does wear glasses.  No contacts. States pain to skin, and not eye.  Denies others around him with similar.  Reports some light sensitivity to right eye only. Denies left eye complaints.  Also reports some itching and watering. Denies any purulent drainage or trauma.  Denies chest pain, shortness of breath, abdominal pain. Denies recent sickness. Denies recent antibiotic use.    Past Medical History:  Diagnosis Date  . Asthma   . Biceps muscle tear   . MRSA (methicillin resistant Staphylococcus aureus) carrier    Infection rt leg  . Sciatic nerve pain     There are no active problems to display for this patient.   Past Surgical History:  Procedure Laterality Date  .  APPENDECTOMY    . COLON SURGERY    . FOOT SURGERY Right    Foot almost amputated  . KNEE ARTHROSCOPY W/ DEBRIDEMENT Right      No current facility-administered medications for this encounter.   Current Outpatient Medications:  .  ibuprofen (ADVIL,MOTRIN) 800 MG tablet, Take 1 tablet (800 mg total) by mouth 3 (three) times daily., Disp: 21 tablet, Rfl: 0 .  PE-Diphenhydramine-DM-GG-APAP (MUCINEX FAST-MAX DAY/NIGHT TAB PO), Take 2 tablets by mouth every 4 (four) hours as needed (for congestion)., Disp: , Rfl:  .  Triamcinolone Acetonide (NASACORT ALLERGY 24HR NA), Place into the nose., Disp: , Rfl:  .  albuterol (PROVENTIL HFA;VENTOLIN HFA) 108 (90 Base) MCG/ACT inhaler, Inhale 1-2 puffs into the lungs every 6 (six) hours as needed for wheezing or shortness of breath. Use with spacer, Disp: 1 Inhaler, Rfl: 0 .  amoxicillin (AMOXIL) 500 MG capsule, Take 1 capsule (500 mg total) by mouth 3 (three) times daily., Disp: 30 capsule, Rfl: 0 .  amoxicillin-clavulanate (AUGMENTIN) 875-125 MG tablet, Take 1 tablet by mouth every 12 (twelve) hours., Disp: 20 tablet, Rfl: 0 .  benzonatate (TESSALON) 200 MG capsule, Take one cap TID PRN cough, Disp: 30 capsule, Rfl: 0 .  chlorpheniramine-HYDROcodone (TUSSIONEX PENNKINETIC ER) 10-8 MG/5ML SUER, Take 5 mLs by mouth 2 (two) times daily., Disp: 115 mL, Rfl: 0 .  diphenhydrAMINE (BENADRYL) 25 mg capsule, Take 1 capsule (25 mg total) by mouth every 4 (four) hours as needed., Disp: 30 capsule, Rfl: 0 .  erythromycin ophthalmic ointment, Place 1 application into the right eye 4 (four) times daily. For seven days, Disp: 3.5  g, Rfl: 0 .  fexofenadine-pseudoephedrine (ALLEGRA-D) 60-120 MG 12 hr tablet, Take 1 tablet by mouth 2 (two) times daily., Disp: 20 tablet, Rfl: 0 .  fluticasone (FLONASE) 50 MCG/ACT nasal spray, Place 2 sprays into both nostrils daily., Disp: 16 g, Rfl: 0 .  HYDROcodone-acetaminophen (NORCO/VICODIN) 5-325 MG tablet, Take 1 tablet by mouth every 4  (four) hours as needed (cough)., Disp: 20 tablet, Rfl: 0 .  methocarbamol (ROBAXIN) 500 MG tablet, Take 1 tablet (500 mg total) by mouth 3 (three) times daily. (Patient not taking: Reported on 01/27/2015), Disp: 21 tablet, Rfl: 0 .  methylPREDNISolone (MEDROL DOSEPAK) 4 MG TBPK tablet, Take Tapered dose as directed, Disp: 21 tablet, Rfl: 0 .  oxyCODONE-acetaminophen (PERCOCET/ROXICET) 5-325 MG per tablet, Take 1 tablet by mouth every 4 (four) hours as needed. (Patient not taking: Reported on 01/27/2015), Disp: 15 tablet, Rfl: 0 .  predniSONE (DELTASONE) 10 MG tablet, 6, 5, 4, 3, 2 then 1 tablet by mouth daily for 6 days total., Disp: 21 tablet, Rfl: 0  Allergies Clarithromycin; Codeine; Bee venom; Morphine and related; and Latex  Family History  Problem Relation Age of Onset  . Healthy Mother   . Heart disease Father     Social History Social History   Tobacco Use  . Smoking status: Current Every Day Smoker    Packs/day: 0.50    Types: Cigarettes  . Smokeless tobacco: Never Used  Substance Use Topics  . Alcohol use: No  . Drug use: No    Review of Systems Constitutional: No fever/chills Eyes: No visual loss. As above. ENT: No sore throat. Cardiovascular: Denies chest pain. Respiratory: Denies shortness of breath. Gastrointestinal: No abdominal pain.   Musculoskeletal: Negative for back pain. Skin: Negative for rash. Neurological: Negative for headaches, focal weakness or numbness.   ____________________________________________   PHYSICAL EXAM:  VITAL SIGNS: ED Triage Vitals  Enc Vitals Group     BP 06/16/17 1952 133/88     Pulse Rate 06/16/17 1952 81     Resp 06/16/17 1952 16     Temp 06/16/17 1952 98.4 F (36.9 C)     Temp Source 06/16/17 1952 Oral     SpO2 06/16/17 1952 97 %     Weight 06/16/17 1953 241 lb 8 oz (109.5 kg)     Height 06/16/17 1953 6' (1.829 m)     Head Circumference --      Peak Flow --      Pain Score 06/16/17 1953 8     Pain Loc --       Pain Edu? --      Excl. in GC? --      Visual Acuity  Right Eye Distance: 20/30 Left Eye Distance: 20/20 Bilateral Distance: 20/20   Constitutional: Alert and oriented. Well appearing and in no acute distress. Eyes:  Minimal diffuse right conjunctival injection, no left.  No drainage or exudate bilaterally.  No foreign body visualized.  No surrounding bony tenderness bilaterally.  Right medial upper eyelid with mild erythema and localized edema, and immediately along eyelid margin slightly darkened erythema with minimal induration, no fluctuance, no drainage, no pointing abscess.  No other facial erythema noted.  PERRL. EOMI. No pain with EOMs. Erythema and edema noncircumferential.No break in skin noted. Head: Atraumatic.Mild to moderate tenderness to palpation bilateral frontal and maxillary sinuses. No swelling. No erythema.   Ears:left: Nontender, normal canal, mild effusion present, no erythema, otherwise normal TM.  Right: Nontender, normal canal, moderate erythema with effusion present,  otherwise normal TM.  No mastoid tenderness bilaterally.  Nose: nasal congestion with bilateral nasal turbinate erythema and edema.   Mouth/Throat: Mucous membranes are moist.  Oropharynx non-erythematous.No tonsillar swelling or exudate.  Neck: No stridor.  No cervical spine tenderness to palpation. Hematological/Lymphatic/Immunilogical: No cervical lymphadenopathy. Cardiovascular: Normal rate, regular rhythm. Grossly normal heart sounds.  Good peripheral circulation. Respiratory: Normal respiratory effort.  No retractions.No wheezes, rales or rhonchi. Good air movement.  Musculoskeletal: No cervical, thoracic or lumbar tenderness to palpation.  Neurologic:  Normal speech and language. No gross focal neurologic deficits are appreciated. No gait instability. Skin:  Skin is warm, dry and intact. No rash noted. Psychiatric: Mood and affect are normal. Speech and behavior are  normal.  ___________________________________________   LABS (all labs ordered are listed, but only abnormal results are displayed)    PROCEDURES Procedures   INITIAL IMPRESSION / ASSESSMENT AND PLAN / ED COURSE  Pertinent labs & imaging results that were available during my care of the patient were reviewed by me and considered in my medical decision making (see chart for details).  Well appearing, no acute distress.  Suspect of recent allergic rhinitis, suspect secondary sinusitis, and discussed allergic conjunctivitis, and suspect patient developing right eyelid stye.  Also counseled regarding surrounding cellulitis and counseled for monitoring.  Will start patient on oral Augmentin as well as erythromycin ophthalmic ointment.  Patient reports allergic to erythromycin, only causing GI upset and reports use of erythromycin ointment successfully in the past without side effects.  Encouraged over-the-counter decongestants, rest, fluids and supportive care.Discussed indication, risks and benefits of medications with patient.  Discussed follow up with Primary care physician this week. Discussed follow up and return parameters including no resolution or any worsening concerns. Patient verbalized understanding and agreed to plan.   ____________________________________________   FINAL CLINICAL IMPRESSION(S) / ED DIAGNOSES  Final diagnoses:  Acute maxillary sinusitis, recurrence not specified  Right otitis media with effusion  Irritation of right eye  Allergic rhinitis, unspecified seasonality, unspecified trigger     ED Discharge Orders        Ordered    amoxicillin-clavulanate (AUGMENTIN) 875-125 MG tablet  Every 12 hours     06/16/17 2108    erythromycin ophthalmic ointment  4 times daily     06/16/17 2108       Note: This dictation was prepared with Dragon dictation along with smaller phrase technology. Any transcriptional errors that result from this process are  unintentional.         Renford DillsMiller, Lamontae Ricardo, NP 06/16/17 2133

## 2018-03-22 ENCOUNTER — Ambulatory Visit
Admission: EM | Admit: 2018-03-22 | Discharge: 2018-03-22 | Disposition: A | Discharge status: Home / Self Care | Payer: Self-pay | Attending: Family Medicine | Admitting: Family Medicine

## 2018-03-22 DIAGNOSIS — H6501 Acute serous otitis media, right ear: Secondary | ICD-10-CM | POA: Insufficient documentation

## 2018-03-22 DIAGNOSIS — R21 Rash and other nonspecific skin eruption: Secondary | ICD-10-CM | POA: Insufficient documentation

## 2018-03-22 MED ORDER — AMOXICILLIN 875 MG PO TABS: 875.0000 mg | ORAL_TABLET | Freq: Two times a day (BID) | ORAL | 0 refills | Status: DC

## 2018-03-22 MED ORDER — PREDNISONE 20 MG PO TABS: ORAL_TABLET | ORAL | 0 refills | Status: DC

## 2018-03-22 NOTE — ED Provider Notes (Signed)
[facility]    CSN: [id number] Arrival date & time: 03/22/18  1031     History   Chief Complaint Chief Complaint  Patient presents with  . Otalgia  . Rash    HPI Dean Pierce is a 48 y.o. male.   The history is provided by the patient.  Otalgia  Location:  Right Quality:  Aching Severity:  Moderate Onset quality:  Sudden Duration:  1 week Timing:  Constant Progression:  Unchanged Context: recent URI   Relieved by:  None tried Ineffective treatments:  None tried Associated symptoms: rash     Past Medical History:  Diagnosis Date  . Asthma   . Biceps muscle tear   . MRSA (methicillin resistant Staphylococcus aureus) carrier    Infection rt leg  . Sciatic nerve pain     There are no active problems to display for this patient.   Past Surgical History:  Procedure Laterality Date  . APPENDECTOMY    . COLON SURGERY    . FOOT SURGERY Right    Foot almost amputated  . KNEE ARTHROSCOPY W/ DEBRIDEMENT Right        Home Medications    Prior to Admission medications   Medication Sig Start Date End Date Taking? Authorizing Provider  albuterol (PROVENTIL HFA;VENTOLIN HFA) 108 (90 Base) MCG/ACT inhaler Inhale 1-2 puffs into the lungs every 6 (six) hours as needed for wheezing or shortness of breath. Use with spacer 11/11/16   Lutricia Feiloemer, William P, PA-C  amoxicillin (AMOXIL) 875 MG tablet Take 1 tablet (875 mg total) by mouth 2 (two) times daily. 03/22/18   [name], Elison Worrel, MD  amoxicillin-clavulanate (AUGMENTIN) 875-125 MG tablet Take 1 tablet by mouth every 12 (twelve) hours. 06/16/17   Renford DillsMiller, Lindsey, NP  benzonatate (TESSALON) 200 MG capsule Take one cap TID PRN cough 11/11/16   Lutricia Feiloemer, William P, PA-C  chlorpheniramine-HYDROcodone [facility](TUSSIONEX PENNKINETIC ER) 10-8 MG/5ML SUER Take 5 mLs by mouth 2 (two) times daily. 11/11/16   Lutricia Feiloemer, William P, PA-C  diphenhydrAMINE (BENADRYL) 25 mg capsule Take 1 capsule (25 mg total) by mouth every 4 (four) hours as  needed. 08/19/16 08/19/17  Enid DerryWagner, Ashley, PA-C  erythromycin ophthalmic ointment Place 1 application into the right eye 4 (four) times daily. For seven days 06/16/17   Renford DillsMiller, Lindsey, NP  fexofenadine-pseudoephedrine (ALLEGRA-D) 60-120 MG 12 hr tablet Take 1 tablet by mouth 2 (two) times daily. 04/03/17   Joni ReiningSmith, Ronald K, PA-C  fluticasone (FLONASE) 50 MCG/ACT nasal spray Place 2 sprays into both nostrils daily. 08/19/16 08/19/17  Enid DerryWagner, Ashley, PA-C  HYDROcodone-acetaminophen (NORCO/VICODIN) 5-325 MG tablet Take 1 tablet by mouth every 4 (four) hours as needed (cough). 01/28/15   Burgess AmorIdol, Julie, PA-C  ibuprofen (ADVIL,MOTRIN) 800 MG tablet Take 1 tablet (800 mg total) by mouth 3 (three) times daily. 10/13/14   Burgess AmorIdol, Julie, PA-C  methocarbamol (ROBAXIN) 500 MG tablet Take 1 tablet (500 mg total) by mouth 3 (three) times daily. Patient not taking: Reported on 01/27/2015 11/09/14   Ivery QualeBryant, Hobson, PA-C  methylPREDNISolone (MEDROL DOSEPAK) 4 MG TBPK tablet Take Tapered dose as directed 04/03/17   Joni ReiningSmith, Ronald K, PA-C  oxyCODONE-acetaminophen (PERCOCET/ROXICET) 5-325 MG per tablet Take 1 tablet by mouth every 4 (four) hours as needed. Patient not taking: Reported on 01/27/2015 10/13/14   Burgess AmorIdol, Julie, PA-C  PE-Diphenhydramine-DM-GG-APAP (MUCINEX FAST-MAX DAY/NIGHT TAB PO) Take 2 tablets by mouth every 4 (four) hours as needed (for congestion).    [provider]  predniSONE (DELTASONE) 20 MG tablet  3 tabs po once day 1, then 2 tabs po qd x 2 days, then 1 tab po qd x 2 days, then half a tab po qd x 2 days 03/22/18   [name], Quynh Basso, MD  Triamcinolone Acetonide (NASACORT ALLERGY 24HR NA) Place into the nose.    [provider]    Family History Family History  Problem Relation Age of Onset  . Healthy Mother   . Heart disease Father     Social History Social History   Tobacco Use  . Smoking status: Current Every Day Smoker    Packs/day: 0.50    Types: Cigarettes  . Smokeless tobacco: Never  Used  Substance Use Topics  . Alcohol use: No  . Drug use: No     Allergies   Clarithromycin; Codeine; Bee venom; Morphine and related; and Latex   Review of Systems Review of Systems  HENT: Positive for ear pain.   Skin: Positive for rash.     Physical Exam Triage Vital Signs ED Triage Vitals  Enc Vitals Group     BP 03/22/18 1055 (!) 130/93     Pulse Rate 03/22/18 1055 79     Resp 03/22/18 1055 18     Temp 03/22/18 1055 98.1 F (36.7 C)     Temp Source 03/22/18 1055 Oral     SpO2 03/22/18 1055 98 %     Weight 03/22/18 1057 225 lb (102.1 kg)     Height 03/22/18 1057 6' (1.829 m)     Head Circumference --      Peak Flow --      Pain Score 03/22/18 1057 3     Pain Loc --      Pain Edu? --      Excl. in GC? --    No data found.  Updated Vital Signs BP (!) 130/93 (BP Location: Right Arm)   Pulse 79   Temp 98.1 F (36.7 C) (Oral)   Resp 18   Ht 6' (1.829 m)   Wt 102.1 kg   SpO2 98%   BMI 30.52 kg/m   Visual Acuity Right Eye Distance:   Left Eye Distance:   Bilateral Distance:    Right Eye Near:   Left Eye Near:    Bilateral Near:     Physical Exam Vitals signs and nursing note reviewed.  Constitutional:      General: He is not in acute distress.    Appearance: He is well-developed. He is not diaphoretic.  HENT:     Head: Normocephalic and atraumatic.     Right Ear: Ear canal and external ear normal. A middle ear effusion is present. Tympanic membrane is erythematous and bulging.     Left Ear: Tympanic membrane, ear canal and external ear normal.     Nose: Nose normal.     Mouth/Throat:     Pharynx: Uvula midline. No oropharyngeal exudate.     Tonsils: No tonsillar abscesses.  Eyes:     General: No scleral icterus.       Right eye: No discharge.        Left eye: No discharge.  Neck:     Musculoskeletal: Normal range of motion and neck supple.     Thyroid: No thyromegaly.     Trachea: No tracheal deviation.  Cardiovascular:     Rate and  Rhythm: Normal rate and regular rhythm.     Heart sounds: Normal heart sounds.  Pulmonary:     Effort: Pulmonary effort is normal.  No respiratory distress.     Breath sounds: Normal breath sounds. No stridor. No wheezing, rhonchi or rales.  Chest:     Chest wall: No tenderness.  Lymphadenopathy:     Cervical: No cervical adenopathy.  Skin:    General: Skin is warm and dry.     Findings: Rash present. Rash is papular.  Neurological:     Mental Status: He is alert.      UC Treatments / Results  Labs (all labs ordered are listed, but only abnormal results are displayed) Labs Reviewed - No data to display  EKG None  Radiology No results found.  Procedures Procedures (including critical care time)  Medications Ordered in UC Medications - No data to display  Initial Impression / Assessment and Plan / UC Course  I have reviewed the triage vital signs and the nursing notes.  Pertinent labs & imaging results that were available during my care of the patient were reviewed by me and considered in my medical decision making (see chart for details).      Final Clinical Impressions(s) / UC Diagnoses   Final diagnoses:  Right acute serous otitis media, recurrence not specified  Rash    ED Prescriptions    Medication Sig Dispense Auth. Provider   amoxicillin (AMOXIL) 875 MG tablet Take 1 tablet (875 mg total) by mouth 2 (two) times daily. 20 tablet Payton Mccallum, MD   predniSONE (DELTASONE) 20 MG tablet 3 tabs po once day 1, then 2 tabs po qd x 2 days, then 1 tab po qd x 2 days, then half a tab po qd x 2 days 10 tablet Payton Mccallum, MD     1. diagnosis reviewed with patient 2. rx as per orders above; reviewed possible side effects, interactions, risks and benefits  3. Recommend supportive treatment with otc analgesics prn   4. Follow-up prn if symptoms worsen or don't improve   Controlled Substance Prescriptions [place] Controlled Substance Registry consulted? Not  Applicable   Payton Mccallum, MD 03/22/18 [id number]

## 2018-03-22 NOTE — ED Triage Notes (Signed)
Pt here for right ear pain for 1 week, states it feels like a burning sensation as if something is crawling in his ear. Hurts when he lays on his right side. Also has rash on the bottom of his legs and both arms since yesterday. It does itch and has been using cortisone with some slight relief.

## 2018-07-14 ENCOUNTER — Ambulatory Visit
Admission: EM | Admit: 2018-07-14 | Discharge: 2018-07-14 | Disposition: A | Discharge status: Home / Self Care | Payer: Self-pay | Attending: Family Medicine | Admitting: Family Medicine

## 2018-07-14 ENCOUNTER — Other Ambulatory Visit: Payer: Self-pay

## 2018-07-14 ENCOUNTER — Encounter: Payer: Self-pay | Admitting: Emergency Medicine

## 2018-07-14 DIAGNOSIS — R21 Rash and other nonspecific skin eruption: Secondary | ICD-10-CM

## 2018-07-14 MED ORDER — HYDROXYZINE HCL 25 MG PO TABS: 25.0000 mg | ORAL_TABLET | Freq: Three times a day (TID) | ORAL | 0 refills | Status: DC | PRN

## 2018-07-14 MED ORDER — PREDNISONE 10 MG PO TABS: ORAL_TABLET | ORAL | 0 refills | Status: DC

## 2018-07-14 NOTE — ED Provider Notes (Signed)
[facility]    CSN: [id number] Arrival date & time: 07/14/18  1105  History   Chief Complaint Chief Complaint  Patient presents with  . Rash   HPI  48 year old male presents with rash.  Patient reports that he has had ongoing rash since December.  Has been intermittent but has been more consistent recently.  Patient states that it is located on his arms particularly around the wrist.  Also located on the forearms, neck, back.  He states it is quite itchy.  Has gotten severe now and is interfering with sleep.  Patient is concerned that this is secondary to exposures at work.  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  No relieving factors.  No known exacerbating factors.  No other associated symptoms.  No other complaints.  PMH, Surgical Hx, Family Hx, Social History reviewed and updated as below.  Past Medical History:  Diagnosis Date  . Asthma   . Biceps muscle tear   . MRSA (methicillin resistant Staphylococcus aureus) carrier    Infection rt leg  . Sciatic nerve pain    Past Surgical History:  Procedure Laterality Date  . APPENDECTOMY    . COLON SURGERY    . FOOT SURGERY Right    Foot almost amputated  . KNEE ARTHROSCOPY W/ DEBRIDEMENT Right    Home Medications    Prior to Admission medications   Medication Sig Start Date End Date Taking? Authorizing Provider  chlorpheniramine-HYDROcodone (TUSSIONEX PENNKINETIC ER) 10-8 MG/5ML SUER Take 5 mLs by mouth 2 (two) times daily. 11/11/16   Lutricia Feil, PA-C  hydrOXYzine (ATARAX/VISTARIL) 25 MG tablet Take 1 tablet (25 mg total) by mouth every 8 (eight) hours as needed. 07/14/18   Tommie Sams, DO  predniSONE (DELTASONE) 10 MG tablet 50 mg daily x 2 days, then 40 mg daily x 2 days, then 30 mg daily x 2 days, then 20 mg daily x 2 days, then 10 mg daily x 2 days. 07/14/18   Tommie Sams, DO    Family History Family History  Problem Relation Age of Onset  . Healthy Mother   . Heart disease Father      Social History Social History   Tobacco Use  . Smoking status: Current Every Day Smoker    Packs/day: 0.50    Types: Cigarettes  . Smokeless tobacco: Never Used  Substance Use Topics  . Alcohol use: No  . Drug use: No     Allergies   Clarithromycin; Codeine; Bee venom; Morphine and related; and Latex   Review of Systems Review of Systems  Constitutional: Negative.   Skin: Positive for rash.   Physical Exam Triage Vital Signs ED Triage Vitals  Enc Vitals Group     BP 07/14/18 1125 (!) 153/101     Pulse Rate 07/14/18 1125 91     Resp 07/14/18 1125 18     Temp 07/14/18 1125 98.5 F (36.9 C)     Temp Source 07/14/18 1125 Oral     SpO2 07/14/18 1125 100 %     Weight 07/14/18 1126 240 lb (108.9 kg)     Height 07/14/18 1126 6' (1.829 m)     Head Circumference --      Peak Flow --      Pain Score 07/14/18 1125 0     Pain Loc --      Pain Edu? --      Excl. in GC? --    Updated  Vital Signs BP (!) 153/101 (BP Location: Right Arm)   Pulse 91   Temp 98.5 F (36.9 C) (Oral)   Resp 18   Ht 6' (1.829 m)   Wt 108.9 kg   SpO2 100%   BMI 32.55 kg/m   Visual Acuity Right Eye Distance:   Left Eye Distance:   Bilateral Distance:    Right Eye Near:   Left Eye Near:    Bilateral Near:     Physical Exam Vitals signs and nursing note reviewed.  Constitutional:      General: He is not in acute distress.    Appearance: Normal appearance.  HENT:     Head: Normocephalic and atraumatic.  Eyes:     General:        Right eye: No discharge.        Left eye: No discharge.     Conjunctiva/sclera: Conjunctivae normal.  Pulmonary:     Effort: Pulmonary effort is normal. No respiratory distress.  Skin:    Comments: Wrists, forearms - scattered raised erythematous papules.  Evidence of excoriation noted.  Neurological:     Mental Status: He is alert.  Psychiatric:        Mood and Affect: Mood normal.        Behavior: Behavior normal.    UC Treatments / Results  Labs  (all labs ordered are listed, but only abnormal results are displayed) Labs Reviewed - No data to display  EKG None  Radiology No results found.  Procedures Procedures (including critical care time)  Medications Ordered in UC Medications - No data to display  Initial Impression / Assessment and Plan / UC Course  I have reviewed the triage vital signs and the nursing notes.  Pertinent labs & imaging results that were available during my care of the patient were reviewed by me and considered in my medical decision making (see chart for details).    48 year old male presents with rash.  Contact versus allergic.  Placing on pr77ednisone.  Atarax for itching.  Final Clinical Impressions(s) / UC Diagnoses   Final diagnoses:  Rash     Discharge Instructions     Medications as prescribed.  See Dermatology if persists. [facility].  Take care  Dr. Adriana Simasook    ED Prescriptions    Medication Sig Dispense Auth. Provider   predniSONE (DELTASONE) 10 MG tablet 50 mg daily x 2 days, then 40 mg daily x 2 days, then 30 mg daily x 2 days, then 20 mg daily x 2 days, then 10 mg daily x 2 days. 30 tablet Johnnell Liou G, DO   hydrOXYzine (ATARAX/VISTARIL) 25 MG tablet Take 1 tablet (25 mg total) by mouth every 8 (eight) hours as needed. 30 tablet Tommie Samsook, Kaliyan Osbourn G, DO     Controlled Substance Prescriptions [place] Controlled Substance Registry consulted? Not Applicable   Tommie SamsCook, Teniya Filter G, DO 07/14/18 1420

## 2018-07-14 NOTE — ED Triage Notes (Signed)
Patient c/o intermittent rash that has been going on since December. He states any area that is exposed at work will get the rash. He is concerned he is being exposed to something at work.

## 2018-07-14 NOTE — Discharge Instructions (Signed)
Medications as prescribed.  See Dermatology if persists. I recommend [facility].  Take care  Dr. Adriana Simas

## 2018-09-15 ENCOUNTER — Ambulatory Visit (INDEPENDENT_AMBULATORY_CARE_PROVIDER_SITE_OTHER): Payer: No Typology Code available for payment source

## 2018-09-15 ENCOUNTER — Other Ambulatory Visit: Payer: Self-pay

## 2018-09-15 ENCOUNTER — Ambulatory Visit
Admission: EM | Admit: 2018-09-15 | Discharge: 2018-09-15 | Disposition: A | Discharge status: Home / Self Care | Payer: No Typology Code available for payment source | Attending: Family Medicine | Admitting: Family Medicine

## 2018-09-15 ENCOUNTER — Encounter: Payer: Self-pay | Admitting: Emergency Medicine

## 2018-09-15 DIAGNOSIS — M25562 Pain in left knee: Secondary | ICD-10-CM | POA: Diagnosis not present

## 2018-09-15 DIAGNOSIS — S83412A Sprain of medial collateral ligament of left knee, initial encounter: Secondary | ICD-10-CM

## 2018-09-15 DIAGNOSIS — Z042 Encounter for examination and observation following work accident: Secondary | ICD-10-CM

## 2018-09-15 MED ORDER — OXYCODONE-ACETAMINOPHEN 5-325 MG PO TABS: ORAL_TABLET | ORAL | 0 refills | Status: DC

## 2018-09-15 NOTE — ED Triage Notes (Signed)
Patient c/o left knee pain after getting off a tractor and twisted his left knee today while at work.

## 2018-09-15 NOTE — Discharge Instructions (Signed)
Rest, ice, elevation, ibuprofen °

## 2018-09-15 NOTE — ED Provider Notes (Signed)
[facility]    CSN: [id number] Arrival date & time: 09/15/18  1339     History   Chief Complaint Chief Complaint  Patient presents with  . Knee Pain  . Worker's Comp Injury    HPI Dean Pierce is a 48 y.o. male.   48 yo male with a c/o left knee pain and swelling after injuring it at work today. States he stepped off a tractor onto a rock and twisted his left knee.    Knee Pain   Past Medical History:  Diagnosis Date  . Asthma   . Biceps muscle tear   . MRSA (methicillin resistant Staphylococcus aureus) carrier    Infection rt leg  . Sciatic nerve pain     There are no active problems to display for this patient.   Past Surgical History:  Procedure Laterality Date  . APPENDECTOMY    . COLON SURGERY    . FOOT SURGERY Right    Foot almost amputated  . KNEE ARTHROSCOPY W/ DEBRIDEMENT Right        Home Medications    Prior to Admission medications   Medication Sig Start Date End Date Taking? Authorizing Provider  chlorpheniramine-HYDROcodone (TUSSIONEX PENNKINETIC ER) 10-8 MG/5ML SUER Take 5 mLs by mouth 2 (two) times daily. 11/11/16   Lutricia Feiloemer, William P, PA-C  hydrOXYzine (ATARAX/VISTARIL) 25 MG tablet Take 1 tablet (25 mg total) by mouth every 8 (eight) hours as needed. 07/14/18   Tommie Samsook, Jayce G, DO  oxyCODONE-acetaminophen (PERCOCET/ROXICET) 5-325 MG tablet 1-2 tabs po qd prn 09/15/18   [name], Devanshi Califf, MD  predniSONE (DELTASONE) 10 MG tablet 50 mg daily x 2 days, then 40 mg daily x 2 days, then 30 mg daily x 2 days, then 20 mg daily x 2 days, then 10 mg daily x 2 days. 07/14/18   Tommie Samsook, Jayce G, DO    Family History Family History  Problem Relation Age of Onset  . Healthy Mother   . Heart disease Father     Social History Social History   Tobacco Use  . Smoking status: Current Every Day Smoker    Packs/day: 0.50    Types: Cigarettes  . Smokeless tobacco: Never Used  Substance Use Topics  . Alcohol use: No  . Drug use: No      Allergies   Clarithromycin, Codeine, Bee venom, Morphine and related, and Latex   Review of Systems Review of Systems   Physical Exam Triage Vital Signs ED Triage Vitals  Enc Vitals Group     BP 09/15/18 1403 138/88     Pulse Rate 09/15/18 1403 85     Resp 09/15/18 1403 16     Temp 09/15/18 1403 98.3 F (36.8 C)     Temp Source 09/15/18 1403 Oral     SpO2 09/15/18 1403 97 %     Weight 09/15/18 1401 239 lb (108.4 kg)     Height 09/15/18 1401 6' (1.829 m)     Head Circumference --      Peak Flow --      Pain Score 09/15/18 1401 10     Pain Loc --      Pain Edu? --      Excl. in GC? --    No data found.  Updated Vital Signs BP 138/88 (BP Location: Left Arm)   Pulse 85   Temp 98.3 F (36.8 C) (Oral)   Resp 16   Ht 6' (1.829 m)   Wt 108.4 kg  SpO2 97%   BMI 32.41 kg/m   Visual Acuity Right Eye Distance:   Left Eye Distance:   Bilateral Distance:    Right Eye Near:   Left Eye Near:    Bilateral Near:     Physical Exam Vitals signs and nursing note reviewed.  Constitutional:      General: He is not in acute distress.    Appearance: He is not toxic-appearing or diaphoretic.  Musculoskeletal:     Left knee: He exhibits decreased range of motion, swelling and bony tenderness (medial). He exhibits no effusion, no ecchymosis, no deformity, no laceration, no erythema, normal alignment, no LCL laxity and normal patellar mobility. Tenderness found. MCL tenderness noted. No patellar tendon tenderness noted.     Comments: Left lower extremity neurovascularly intact  Neurological:     Mental Status: He is alert.      UC Treatments / Results  Labs (all labs ordered are listed, but only abnormal results are displayed) Labs Reviewed - No data to display  EKG   Radiology Dg Knee Complete 4 Views Left  Result Date: 09/15/2018 CLINICAL DATA:  Left knee pain, twisting injury. EXAM: LEFT KNEE - COMPLETE 4+ VIEW COMPARISON:  12/22/2016 FINDINGS: No acute bony  abnormality. Specifically, no fracture, subluxation, or dislocation. Joint spaces maintained. No joint effusion. IMPRESSION: No acute bony abnormality. Electronically Signed   By: Rolm Baptise M.D.   On: 09/15/2018 15:02    Procedures Procedures (including critical care time)  Medications Ordered in UC Medications - No data to display  Initial Impression / Assessment and Plan / UC Course  I have reviewed the triage vital signs and the nursing notes.  Pertinent labs & imaging results that were available during my care of the patient were reviewed by me and considered in my medical decision making (see chart for details).      Final Clinical Impressions(s) / UC Diagnoses   Final diagnoses:  Sprain of medial collateral ligament of left knee, initial encounter     Discharge Instructions     Rest, ice, elevation, ibuprofen     ED Prescriptions    Medication Sig Dispense Auth. Provider   oxyCODONE-acetaminophen (PERCOCET/ROXICET) 5-325 MG tablet 1-2 tabs po qd prn 6 tablet Norval Gable, MD      1. x-ray result (negative) and diagnosis reviewed with patient 2. rx as per orders above; reviewed possible side effects, interactions, risks and benefits; patient states he's taken Percocet in the past without any problems/without any allergies  3. Recommend supportive treatment as above 4. Work restrictions as per work form 5. Follow up at [facility] next week 6. Follow-up prn    Controlled Substance Prescriptions [place] Controlled Substance Registry consulted? yes   Norval Gable, MD 09/15/18 1538

## 2019-01-11 ENCOUNTER — Other Ambulatory Visit: Payer: Self-pay

## 2019-01-11 ENCOUNTER — Ambulatory Visit
Admission: EM | Admit: 2019-01-11 | Discharge: 2019-01-11 | Disposition: A | Discharge status: Home / Self Care | Payer: Self-pay | Attending: Urgent Care | Admitting: Urgent Care

## 2019-01-11 ENCOUNTER — Encounter: Payer: Self-pay | Admitting: Emergency Medicine

## 2019-01-11 DIAGNOSIS — R0981 Nasal congestion: Secondary | ICD-10-CM

## 2019-01-11 DIAGNOSIS — J069 Acute upper respiratory infection, unspecified: Secondary | ICD-10-CM

## 2019-01-11 DIAGNOSIS — Z7189 Other specified counseling: Secondary | ICD-10-CM

## 2019-01-11 DIAGNOSIS — R05 Cough: Secondary | ICD-10-CM

## 2019-01-11 NOTE — Discharge Instructions (Signed)
Over the counter cough and congestion medication as discussed. Rest. Drink plenty of fluids. Monitor self. Remain home unless seeking further care.   Follow up with your primary care physician this week as needed. Return to Urgent care for new or worsening concerns.

## 2019-01-11 NOTE — ED Provider Notes (Signed)
[facility] ____________________________________________  Time seen: Approximately 1:21 PM  I have reviewed the triage vital signs and the nursing notes.   HISTORY  Chief Complaint Cough and Nasal Congestion   HPI Dean Pierce is a 48 y.o. male presenting for evaluation of cough and nasal congestion since yesterday.  States last night cough occasionally wake him up.  Describes cough as occasionally productive but mostly tickly and hacky.  Some postnasal drainage.  Denies fevers, chest pain, shortness of breath, changes in taste or smell, vomiting, diarrhea.  Denies known COVID-19 direct exposure.  Does report his fianc is sick with similar complaints.  Continues eat and drink well.  No over-the-counter medications taken today for the same complaints.  Denies other recent sickness.    Past Medical History:  Diagnosis Date  . Asthma   . Biceps muscle tear   . MRSA (methicillin resistant Staphylococcus aureus) carrier    Infection rt leg  . Sciatic nerve pain     There are no active problems to display for this patient.   Past Surgical History:  Procedure Laterality Date  . APPENDECTOMY    . COLON SURGERY    . FOOT SURGERY Right    Foot almost amputated  . KNEE ARTHROSCOPY W/ DEBRIDEMENT Right      No current facility-administered medications for this encounter.  No current outpatient medications on file.  Allergies Clarithromycin, Codeine, Bee venom, Morphine and related, and Latex  Family History  Problem Relation Age of Onset  . Healthy Mother   . Heart disease Father     Social History Social History   Tobacco Use  . Smoking status: Current Every Day Smoker    Packs/day: 0.50    Types: Cigarettes  . Smokeless tobacco: Never Used  Substance Use Topics  . Alcohol use: No  . Drug use: No    Review of Systems Constitutional: No fever. ENT: No sore throat. Positive congestion.  Cardiovascular: Denies chest pain. Respiratory: Denies  shortness of breath. Gastrointestinal: No abdominal pain. Musculoskeletal: Negative for back pain. Skin: Negative for rash.  ____________________________________________   PHYSICAL EXAM:  VITAL SIGNS: ED Triage Vitals  Enc Vitals Group     BP 01/11/19 1227 (!) 134/98     Pulse Rate 01/11/19 1227 98     Resp 01/11/19 1227 18     Temp 01/11/19 1227 98.2 F (36.8 C)     Temp Source 01/11/19 1227 Oral     SpO2 01/11/19 1227 97 %     Weight 01/11/19 1225 240 lb (108.9 kg)     Height 01/11/19 1225 6' (1.829 m)     Head Circumference --      Peak Flow --      Pain Score 01/11/19 1225 0     Pain Loc --      Pain Edu? --      Excl. in GC? --    Constitutional: Alert and oriented. Well appearing and in no acute distress. Eyes: Conjunctivae are normal. PERRL. EOMI. Head: Atraumatic. No sinus tenderness to palpation. No swelling. No erythema.  Nose:Nasal congestion with clear rhinorrhea  Mouth/Throat: Mucous membranes are moist. No pharyngeal erythema. No tonsillar swelling or exudate.  Neck: No stridor.  No cervical spine tenderness to palpation. Hematological/Lymphatic/Immunilogical: No cervical lymphadenopathy. Cardiovascular: Normal rate, regular rhythm. Grossly normal heart sounds.  Good peripheral circulation. Respiratory: Normal respiratory effort.  No retractions. No wheezes, rales or rhonchi. Good air movement.  Occasional dry cough noted. Musculoskeletal: Ambulatory with  steady gait.  Neurologic:  Normal speech and language. No gait instability. Skin:  Skin appears warm, dry and intact. No rash noted. Psychiatric: Mood and affect are normal. Speech and behavior are normal.  ___________________________________________   LABS (all labs ordered are listed, but only abnormal results are displayed)  Labs Reviewed  NOVEL CORONAVIRUS, NAA (HOSP ORDER, SEND-OUT TO REF LAB; TAT 18-24 HRS)   ____________________________________________  PROCEDURES Procedures    INITIAL  IMPRESSION / ASSESSMENT AND PLAN / ED COURSE  Pertinent labs & imaging results that were available during my care of the patient were reviewed by me and considered in my medical decision making (see chart for details).  Well-appearing patient.  No acute distress.  Suspect viral upper respiratory infection.  COVID-19 testing completed and advice given.  Patient declined need for prescription cough medication.  Encourage rest, fluids, supportive care monitoring.  Discussed follow up with Primary care physician this week as needed. Discussed follow up and return parameters including no resolution or any worsening concerns. Patient verbalized understanding and agreed to plan.   ____________________________________________   FINAL CLINICAL IMPRESSION(S) / ED DIAGNOSES  Final diagnoses:  Upper respiratory tract infection, unspecified type  Advice given about COVID-19 virus infection     ED Discharge Orders    None       Note: This dictation was prepared with Dragon dictation along with smaller phrase technology. Any transcriptional errors that result from this process are unintentional.         Marylene Land, NP 01/11/19 1328

## 2019-01-11 NOTE — ED Triage Notes (Signed)
Patient c/o cough and nasal congestion that started last night. Denies fever. He states he gets bronchitis every year and states this is what is feels like, but will need COVID testing.

## 2019-01-12 LAB — NOVEL CORONAVIRUS, NAA (HOSP ORDER, SEND-OUT TO REF LAB; TAT 18-24 HRS): SARS-CoV-2, NAA: NOT DETECTED

## 2019-01-17 ENCOUNTER — Other Ambulatory Visit: Payer: Self-pay

## 2019-01-17 ENCOUNTER — Encounter: Payer: Self-pay | Admitting: Emergency Medicine

## 2019-01-17 ENCOUNTER — Ambulatory Visit
Admission: EM | Admit: 2019-01-17 | Discharge: 2019-01-17 | Disposition: A | Discharge status: Home / Self Care | Payer: Self-pay | Attending: Family Medicine | Admitting: Family Medicine

## 2019-01-17 ENCOUNTER — Ambulatory Visit (INDEPENDENT_AMBULATORY_CARE_PROVIDER_SITE_OTHER): Payer: Self-pay

## 2019-01-17 DIAGNOSIS — J40 Bronchitis, not specified as acute or chronic: Secondary | ICD-10-CM

## 2019-01-17 DIAGNOSIS — R05 Cough: Secondary | ICD-10-CM

## 2019-01-17 DIAGNOSIS — R059 Cough, unspecified: Secondary | ICD-10-CM

## 2019-01-17 MED ORDER — DOXYCYCLINE HYCLATE 100 MG PO TABS: 100.0000 mg | ORAL_TABLET | Freq: Two times a day (BID) | ORAL | 0 refills | Status: DC

## 2019-01-17 MED ORDER — HYDROCOD POLST-CPM POLST ER 10-8 MG/5ML PO SUER: 5.0000 mL | Freq: Two times a day (BID) | ORAL | 0 refills | Status: DC | PRN

## 2019-01-17 MED ORDER — PREDNISONE 10 MG PO TABS: ORAL_TABLET | ORAL | 0 refills | Status: DC

## 2019-01-17 NOTE — ED Provider Notes (Signed)
[facility]    CSN: [id number] Arrival date & time: 01/17/19  1034      History   Chief Complaint Chief Complaint  Patient presents with  . Cough    HPI Dean Pierce is a 48 y.o. male.   48 yo male with a c/o cough and congestion for over a week. Patient was seen on 11/10 and had a negative covid test. Cough has worsened. Denies any fevers, chills, chest pains. Has h/o asthma per records. Denies any wheezing.    Cough   Past Medical History:  Diagnosis Date  . Asthma   . Biceps muscle tear   . MRSA (methicillin resistant Staphylococcus aureus) carrier    Infection rt leg  . Sciatic nerve pain     There are no active problems to display for this patient.   Past Surgical History:  Procedure Laterality Date  . APPENDECTOMY    . COLON SURGERY    . FOOT SURGERY Right    Foot almost amputated  . KNEE ARTHROSCOPY W/ DEBRIDEMENT Right        Home Medications    Prior to Admission medications   Medication Sig Start Date End Date Taking? Authorizing Provider  chlorpheniramine-HYDROcodone (TUSSIONEX PENNKINETIC ER) 10-8 MG/5ML SUER Take 5 mLs by mouth every 12 (twelve) hours as needed. 01/17/19   Payton Mccallum, MD  doxycycline (VIBRA-TABS) 100 MG tablet Take 1 tablet (100 mg total) by mouth 2 (two) times daily. 01/17/19   Payton Mccallum, MD  predniSONE (DELTASONE) 10 MG tablet Start 60 mg po day one, then 50 mg po day two, taper by 10 mg daily until complete. 01/17/19   Payton Mccallum, MD    Family History Family History  Problem Relation Age of Onset  . Healthy Mother   . Heart disease Father     Social History Social History   Tobacco Use  . Smoking status: Current Every Day Smoker    Packs/day: 0.50    Types: Cigarettes  . Smokeless tobacco: Never Used  Substance Use Topics  . Alcohol use: No  . Drug use: No     Allergies   Clarithromycin, Codeine, Bee venom, Morphine and related, and Latex   Review of Systems Review of  Systems  Respiratory: Positive for cough.      Physical Exam Triage Vital Signs ED Triage Vitals  Enc Vitals Group     BP 01/17/19 1045 (!) 130/95     Pulse Rate 01/17/19 1045 86     Resp --      Temp 01/17/19 1045 98.1 F (36.7 C)     Temp Source 01/17/19 1045 Oral     SpO2 01/17/19 1045 97 %     Weight 01/17/19 1043 240 lb 1.3 oz (108.9 kg)     Height --      Head Circumference --      Peak Flow --      Pain Score 01/17/19 1043 5     Pain Loc --      Pain Edu? --      Excl. in GC? --    No data found.  Updated Vital Signs BP (!) 130/95 (BP Location: Left Arm)   Pulse 86   Temp 98.1 F (36.7 C) (Oral)   Wt 108.9 kg   SpO2 97%   BMI 32.56 kg/m   Visual Acuity Right Eye Distance:   Left Eye Distance:   Bilateral Distance:    Right Eye Near:   Left  Eye Near:    Bilateral Near:     Physical Exam Vitals signs and nursing note reviewed.  Constitutional:      General: He is not in acute distress.    Appearance: He is not toxic-appearing or diaphoretic.  Cardiovascular:     Rate and Rhythm: Normal rate.     Heart sounds: Normal heart sounds.  Pulmonary:     Effort: Pulmonary effort is normal. No respiratory distress.     Breath sounds: No stridor. Rhonchi (diffuse) present. No wheezing or rales.  Neurological:     Mental Status: He is alert.      UC Treatments / Results  Labs (all labs ordered are listed, but only abnormal results are displayed) Labs Reviewed - No data to display  EKG   Radiology Dg Chest 2 View  Result Date: 01/17/2019 : No acute cardiopulmonary process. CLINICAL DATA:  Pt states ha shad bad cough with chest soreness from coughing so much. Neg covid test 4 days ago. Current smoker, hx pneumoniaworsening cough EXAM: CHEST - 2 VIEW COMPARISON:  None. FINDINGS: Normal mediastinum and cardiac silhouette. Normal pulmonary vasculature. No evidence of effusion, infiltrate, or pneumothorax. No acute bony abnormality. IMPRESSION: No acute  cardiopulmonary process. Electronically Signed   By: Suzy Bouchard M.D.   On: 01/17/2019 11:35    Procedures Procedures (including critical care time)  Medications Ordered in UC Medications - No data to display  Initial Impression / Assessment and Plan / UC Course  I have reviewed the triage vital signs and the nursing notes.  Pertinent labs & imaging results that were available during my care of the patient were reviewed by me and considered in my medical decision making (see chart for details).      Final Clinical Impressions(s) / UC Diagnoses   Final diagnoses:  Cough  Bronchitis     Discharge Instructions     Rest, increase water intake    ED Prescriptions    Medication Sig Dispense Auth. Provider   doxycycline (VIBRA-TABS) 100 MG tablet Take 1 tablet (100 mg total) by mouth 2 (two) times daily. 20 tablet Norval Gable, MD   predniSONE (DELTASONE) 10 MG tablet Start 60 mg po day one, then 50 mg po day two, taper by 10 mg daily until complete. 21 tablet Maikayla Beggs, Linward Foster, MD   chlorpheniramine-HYDROcodone (TUSSIONEX PENNKINETIC ER) 10-8 MG/5ML SUER Take 5 mLs by mouth every 12 (twelve) hours as needed. 60 mL Norval Gable, MD     1. x-ray results and diagnosis reviewed with patient 2. rx as per orders above; reviewed possible side effects, interactions, risks and benefits  3. Recommend supportive treatment as above 4. Follow-up prn if symptoms worsen or don't improve  I have reviewed the PDMP during this encounter.   Norval Gable, MD 01/17/19 2053

## 2019-01-17 NOTE — ED Triage Notes (Signed)
Pt c/o cough and congestion. He was tested for covid on 01/13/19 and negative. He states that the cough is worse and keeping him up at night. Denies fever.

## 2019-01-17 NOTE — Discharge Instructions (Signed)
Rest, increase water intake 

## 2019-04-08 ENCOUNTER — Other Ambulatory Visit: Payer: Self-pay

## 2019-04-08 ENCOUNTER — Ambulatory Visit
Admission: EM | Admit: 2019-04-08 | Discharge: 2019-04-08 | Disposition: A | Discharge status: Home / Self Care | Payer: HRSA Program | Attending: Family Medicine | Admitting: Family Medicine

## 2019-04-08 ENCOUNTER — Encounter: Payer: Self-pay | Admitting: Emergency Medicine

## 2019-04-08 DIAGNOSIS — J329 Chronic sinusitis, unspecified: Secondary | ICD-10-CM | POA: Insufficient documentation

## 2019-04-08 DIAGNOSIS — Z20822 Contact with and (suspected) exposure to covid-19: Secondary | ICD-10-CM | POA: Insufficient documentation

## 2019-04-08 DIAGNOSIS — Z7189 Other specified counseling: Secondary | ICD-10-CM | POA: Diagnosis present

## 2019-04-08 NOTE — ED Triage Notes (Signed)
Patient c/o sinus pressure and congestion for a week.  Patient states that he was exposed to his niece who tested positive this week for COVID.

## 2019-04-08 NOTE — Discharge Instructions (Addendum)
It was very nice seeing you today in clinic. Thank you for entrusting me with your care.   Continue interventions for chronic sinus issues. You indicated that you were improving overall and that no interventions were needed.   You were tested for SARS-CoV-2 (novel coronavirus) today. Testing is performed by an outside lab ([facility]) and has variable turn around times ranging between 24-48 hours. Current recommendations from the the CDC and [place] DHHS require that you remain out of work in order to quarantine at home until negative test results are have been received. In the event that your test results are positive, you will be contacted with further directives. These measures are being implemented out of an abundance of caution to prevent transmission and spread during the current SARS-CoV-2 pandemic.  If you develop any worsening symptoms or concerns, make arrangements to follow up with your regular doctor. If your symptoms are severe, please seek follow up care in the ER. Please remember, our [facility] providers are "right here with you" when you need [place].   Again, it was my pleasure to take care of you today. Thank you for choosing our clinic. I hope that you start to feel better quickly.   Quentin Mulling, MSN, APRN, FNP-C, CEN Advanced Practice Provider [facility]

## 2019-04-09 LAB — NOVEL CORONAVIRUS, NAA (HOSP ORDER, SEND-OUT TO REF LAB; TAT 18-24 HRS): SARS-CoV-2, NAA: NOT DETECTED

## 2019-04-09 NOTE — ED Provider Notes (Addendum)
[place], [place]   Name: Dean Pierce DOB: 1970/12/07 MRN: [id number] CSN: [id number] PCP: Patient, No Pcp Per  Arrival date and time:  04/08/19 1407  Chief Complaint:  Sinus Problem and COVID Test   NOTE: Prior to seeing the patient today, I have reviewed the triage nursing documentation and vital signs. Clinical staff has updated patient's PMH/PSHx, current medication list, and drug allergies/intolerances to ensure comprehensive history available to assist in medical decision making.   History:   HPI: Dean Pierce is a 49 y.o. male who presents today with complaints of being exposed to someone who has tested positive for SARS-CoV-2 (novel coronavirus). Patient reports that his nephew's wife tested positive yesterday. She watches his children and his nephew rides to work with him daily. Last exposed to nephew today. Patient with chronic sinus issues. He has some mild congestion and rhinorrhea, however he notes that he gets this "at least 500 times a year". Patient has improved from most recent flare. Patient denies fevers. He denies any cough, shortness of breath, or wheezing. He denies that he has experienced any nausea, vomiting, diarrhea, or abdominal pain. He is eating and drinking well. Patient denies any perceived alterations to his sense of taste or smell. Patient presents out of concerns for his personal health in the midst of the current pandemic. He has also been asked by his employer to provide documentation of a negative SARS-CoV-2 test.  He has not been tested for SARS-CoV-2 (novel coronavirus) in the past 14 days; last tested negative on 02/05/2019 per his report. Patient has not been vaccinated for influenza this season. Despite his symptoms, patient has not taken any over the counter interventions to help improve/relieve his reported symptoms at home.   Past Medical History:  Diagnosis Date  . Asthma   . Biceps muscle tear   . MRSA (methicillin resistant Staphylococcus  aureus) carrier    Infection rt leg  . Sciatic nerve pain     Past Surgical History:  Procedure Laterality Date  . APPENDECTOMY    . COLON SURGERY    . FOOT SURGERY Right    Foot almost amputated  . KNEE ARTHROSCOPY W/ DEBRIDEMENT Right     Family History  Problem Relation Age of Onset  . Healthy Mother   . Heart disease Father     Social History   Tobacco Use  . Smoking status: Current Every Day Smoker    Packs/day: 0.50    Types: Cigarettes  . Smokeless tobacco: Never Used  Substance Use Topics  . Alcohol use: No  . Drug use: No    There are no problems to display for this patient.   Home Medications:    No outpatient medications have been marked as taking for the 04/08/19 encounter [facility] Encounter).    Allergies:   Clarithromycin, Codeine, Bee venom, Morphine and related, and Latex  Review of Systems (ROS): Review of Systems  Constitutional: Negative for fatigue and fever.  HENT: Positive for congestion and rhinorrhea. Negative for ear pain, postnasal drip, sinus pressure, sinus pain, sneezing and sore throat.   Eyes: Negative for pain, discharge and redness.  Respiratory: Negative for cough, chest tightness and shortness of breath.   Cardiovascular: Negative for chest pain and palpitations.  Gastrointestinal: Negative for abdominal pain, diarrhea, nausea and vomiting.  Musculoskeletal: Negative for arthralgias, back pain, myalgias and neck pain.  Skin: Negative for color change, pallor and rash.  Neurological: Negative for dizziness, syncope, weakness and headaches.  Hematological: Negative  for adenopathy.     Vital Signs: Today's Vitals   04/08/19 1426 04/08/19 1429 04/08/19 1448  BP:  130/86   Pulse:  89   Resp:  16   Temp:  98.5 F (36.9 C)   TempSrc:  Oral   SpO2:  97%   Weight: 240 lb 1.3 oz (108.9 kg)    Height: 6' (1.829 m)    PainSc: 0-No pain  0-No pain    Physical Exam: Physical Exam  Constitutional: He is oriented to person,  place, and time and well-developed, well-nourished, and in no distress.  HENT:  Head: Normocephalic and atraumatic.  Right Ear: Tympanic membrane normal.  Left Ear: Tympanic membrane normal.  Nose: Mucosal edema and rhinorrhea present. No sinus tenderness.  Mouth/Throat: Uvula is midline, oropharynx is clear and moist and mucous membranes are normal.  Eyes: Pupils are equal, round, and reactive to light.  Cardiovascular: Normal rate, regular rhythm, normal heart sounds and intact distal pulses.  Pulmonary/Chest: Effort normal and breath sounds normal.  Neurological: He is alert and oriented to person, place, and time. Gait normal.  Skin: Skin is warm and dry. No rash noted. He is not diaphoretic.  Psychiatric: Mood, memory, affect and judgment normal.  Nursing note and vitals reviewed.   Urgent Care Treatments / Results:   Orders Placed This Encounter  Procedures  . Novel Coronavirus, NAA (Hosp order, Send-out to Ref Lab; TAT 18-24 hrs    LABS: PLEASE NOTE: all labs that were ordered this encounter are listed, however only abnormal results are displayed. Labs Reviewed  NOVEL CORONAVIRUS, NAA (HOSP ORDER, SEND-OUT TO REF LAB; TAT 18-24 HRS)    EKG: -None  RADIOLOGY: No results found.  PROCEDURES: Procedures  MEDICATIONS RECEIVED THIS VISIT: Medications - No data to display  PERTINENT CLINICAL COURSE NOTES/UPDATES:   Initial Impression / Assessment and Plan / Urgent Care Course:  Pertinent labs & imaging results that were available during my care of the patient were personally reviewed by me and considered in my medical decision making (see lab/imaging section of note for values and interpretations).  Dean Pierce is a 49 y.o. male who presents to [facility] today with complaints of chronic sinus congestion.   Patient overall well appearing and in no acute distress today in clinic. Presenting symptoms (see HPI) and exam as documented above. He presents with  symptoms associated with SARS-CoV-2 (novel coronavirus) following an indirect exposure. Discussed typical symptom constellation. Reviewed potential for infection and need for testing. Patient amenable to being tested. SARS-CoV-2 swab collected by certified clinical staff. Discussed variable turn around times associated with testing, as swabs are being processed at [facility], and have been taking between 24-48 hours to come back. He was advised to self quarantine, per [facility] DHHS guidelines, until negative results received. These measures are being implemented out of an abundance of caution to prevent transmission and spread during the current SARS-CoV-2 pandemic.  Presenting symptoms consistent with chronic sinus congestion. Patient improving since most recent flare started. Discussed that until ruled out with confirmatory lab testing, SARS-CoV-2 remains part of the differential. His testing is pending at this time. I discussed with him that his symptoms are felt to be viral in nature, thus antibiotics would not offer him any relief or improve his symptoms any faster than conservative symptomatic management. Discussed supportive care measures at home. Patient to rest as much as possible. He was encouraged to ensure adequate hydration (water and ORS) to prevent dehydration and electrolyte derangements.  Patient may use APAP and/or IBU on an as needed basis. May use OTC decongestant and/or mucolytics PRN.   Current clinical condition warrants patient being out of work in order to quarantine while waiting for testing results. Patient does not require documentation to provide to his place of employment at this time. He states, "I have been through this before. I just need the result printed when it comes back". Patient aware he is is required to stay home until the testing results have been received, and that his RTW is contingent on his SARS-CoV-2 test results being reviewed as negative.     Discussed follow up with  primary care physician in 1 week for re-evaluation. I have reviewed the follow up and strict return precautions for any new or worsening symptoms. Patient is aware of symptoms that would be deemed urgent/emergent, and would thus require further evaluation either here or in the emergency department. At the time of discharge, he verbalized understanding and consent with the discharge plan as it was reviewed with him. All questions were fielded by provider and/or clinic staff prior to patient discharge.    Final Clinical Impressions / Urgent Care Diagnoses:   Final diagnoses:  Exposure to COVID-19 virus  Encounter for laboratory testing for COVID-19 virus  Chronic congestion of paranasal sinus  Advice given about COVID-19 virus infection    New Prescriptions:  [place] Controlled Substance Registry consulted? Not Applicable  No orders of the defined types were placed in this encounter.   Recommended Follow up Care:  Patient encouraged to follow up with the following provider within the specified time frame, or sooner as dictated by the severity of his symptoms. As always, he was instructed that for any urgent/emergent care needs, he should seek care either here or in the emergency department for more immediate evaluation.  Follow-up Information    PCP In 1 week.   Why: General reassessment of symptoms if not improving        NOTE: This note was prepared using Scientist, clinical (histocompatibility and immunogenetics) along with smaller Lobbyist. Despite my best ability to proofread, there is the potential that transcriptional errors may still occur from this process, and are completely unintentional.    Verlee Monte, NP 04/09/19 2120

## 2019-11-27 ENCOUNTER — Ambulatory Visit
Admission: EM | Admit: 2019-11-27 | Discharge: 2019-11-27 | Disposition: A | Discharge status: Home / Self Care | Payer: Self-pay | Attending: Emergency Medicine | Admitting: Emergency Medicine

## 2019-11-27 ENCOUNTER — Other Ambulatory Visit: Payer: Self-pay

## 2019-11-27 DIAGNOSIS — G8929 Other chronic pain: Secondary | ICD-10-CM | POA: Insufficient documentation

## 2019-11-27 DIAGNOSIS — R1013 Epigastric pain: Secondary | ICD-10-CM | POA: Insufficient documentation

## 2019-11-27 LAB — COMPREHENSIVE METABOLIC PANEL
ALT: 23 U/L (ref 0–44)
AST: 20 U/L (ref 15–41)
Albumin: 4.3 g/dL (ref 3.5–5.0)
Alkaline Phosphatase: 62 U/L (ref 38–126)
Anion gap: 9 (ref 5–15)
BUN: 16 mg/dL (ref 6–20)
CO2: 26 mmol/L (ref 22–32)
Calcium: 9.4 mg/dL (ref 8.9–10.3)
Chloride: 104 mmol/L (ref 98–111)
Creatinine, Ser: 1.07 mg/dL (ref 0.61–1.24)
GFR calc Af Amer: >60 mL/min (ref >60)
GFR calc non Af Amer: >60 mL/min (ref >60)
Glucose, Bld: 90 mg/dL (ref 70–99)
Potassium: 4.3 mmol/L (ref 3.5–5.1)
Sodium: 139 mmol/L (ref 135–145)
Total Bilirubin: 1.4 mg/dL — ABNORMAL HIGH (ref 0.3–1.2)
Total Protein: 7.4 g/dL (ref 6.5–8.1)

## 2019-11-27 LAB — CBC WITH DIFFERENTIAL/PLATELET
Abs Immature Granulocytes: 0.04 10*3/uL (ref 0.00–0.07)
Basophils Absolute: 0.1 10*3/uL (ref 0.0–0.1)
Basophils Relative: 1 %
Eosinophils Absolute: 0.1 10*3/uL (ref 0.0–0.5)
Eosinophils Relative: 1 %
HCT: 51.1 % (ref 39.0–52.0)
Hemoglobin: 17.5 g/dL — ABNORMAL HIGH (ref 13.0–17.0)
Immature Granulocytes: 1 %
Lymphocytes Relative: 17 %
Lymphs Abs: 1.3 10*3/uL (ref 0.7–4.0)
MCH: 29.4 pg (ref 26.0–34.0)
MCHC: 34.2 g/dL (ref 30.0–36.0)
MCV: 85.7 fL (ref 80.0–100.0)
Monocytes Absolute: 0.6 10*3/uL (ref 0.1–1.0)
Monocytes Relative: 9 %
Neutro Abs: 5.2 10*3/uL (ref 1.7–7.7)
Neutrophils Relative %: 71 %
Platelets: 206 10*3/uL (ref 150–400)
RBC: 5.96 MIL/uL — ABNORMAL HIGH (ref 4.22–5.81)
RDW: 12.1 % (ref 11.5–15.5)
WBC: 7.3 10*3/uL (ref 4.0–10.5)
nRBC: 0 % (ref 0.0–0.2)

## 2019-11-27 LAB — LIPASE, BLOOD: Lipase: 44 U/L (ref 11–51)

## 2019-11-27 MED ORDER — PANTOPRAZOLE SODIUM 40 MG PO TBEC: 40.0000 mg | DELAYED_RELEASE_TABLET | Freq: Every day | ORAL | 0 refills | Status: AC

## 2019-11-27 NOTE — ED Provider Notes (Signed)
[facility]    CSN: [id number] Arrival date & time: 11/27/19  1010      History   Chief Complaint Chief Complaint  Patient presents with  . Abdominal Pain    HPI Dean Pierce is a 49 y.o. male.   HPI  49 year old male presents today complaning of epigastric pain describes as a burning bruised type feeling, a fluttering sensation when it occurs. States he is working 12-hour days return to  home 8:00 last night and the pain had begun and therefore he was unable to eat very much. He states that when he laid down in bed that it occurred again and lasted for approximately 30 minutes. He finds that taking a Tums will provide some temporary relief as does mentholated cough drops. The pain does not seem to radiate. Extensive review of his medical records: he was seen at [facility] for very similar episode. This was in [facility] 2021. Undergone EKG which was normal, CT of the abdomen last chest with contrast showed no evidence of aortic aneurysm or pulmonary embolism.. Troponins were also within normal range. It was recommended that he eventually follow-up with cardiology but because of a lapse in his insurance coverage he has not seen a cardiologist but is planning to resign up for insurance in 1 month.  He has a previous history of reflux and bronchitis. He had been treated in the past with omeprazole but is not taking any medications at the present time other than occasional Tums and ibuprofen or Tylenol.      Past Medical History:  Diagnosis Date  . Asthma   . Biceps muscle tear   . MRSA (methicillin resistant Staphylococcus aureus) carrier    Infection rt leg  . Sciatic nerve pain     There are no problems to display for this patient.   Past Surgical History:  Procedure Laterality Date  . APPENDECTOMY    . COLON SURGERY    . FOOT SURGERY Right    Foot almost amputated  . KNEE ARTHROSCOPY W/ DEBRIDEMENT Right        Home Medications    Prior  to Admission medications   Medication Sig Start Date End Date Taking? Authorizing Provider  pantoprazole (PROTONIX) 40 MG tablet Take 1 tablet (40 mg total) by mouth daily. 11/27/19   Lutricia Feil, PA-C    Family History Family History  Problem Relation Age of Onset  . Healthy Mother   . Heart disease Father     Social History Social History   Tobacco Use  . Smoking status: Current Every Day Smoker    Packs/day: 0.50    Types: Cigarettes  . Smokeless tobacco: Never Used  Vaping Use  . Vaping Use: Never used  Substance Use Topics  . Alcohol use: No  . Drug use: No     Allergies   Clarithromycin, Codeine, Bee venom, Morphine and related, and Latex   Review of Systems Review of Systems  Constitutional: Positive for activity change and appetite change. Negative for chills, diaphoresis, fatigue and fever.  Gastrointestinal: Positive for abdominal pain.  All other systems reviewed and are negative.    Physical Exam Triage Vital Signs ED Triage Vitals  Enc Vitals Group     BP 11/27/19 1025 125/90     Pulse Rate 11/27/19 1025 76     Resp 11/27/19 1025 18     Temp 11/27/19 1025 98.2 F (36.8 C)     Temp Source 11/27/19 1025 Oral  SpO2 11/27/19 1025 100 %     Weight 11/27/19 1025 245 lb (111.1 kg)     Height 11/27/19 1025 6' (1.829 m)     Head Circumference --      Peak Flow --      Pain Score 11/27/19 1024 1     Pain Loc --      Pain Edu? --      Excl. in GC? --    No data found.  Updated Vital Signs BP 125/90 (BP Location: Left Arm)   Pulse 76   Temp 98.2 F (36.8 C) (Oral)   Resp 18   Ht 6' (1.829 m)   Wt 245 lb (111.1 kg)   SpO2 100%   BMI 33.23 kg/m   Visual Acuity Right Eye Distance:   Left Eye Distance:   Bilateral Distance:    Right Eye Near:   Left Eye Near:    Bilateral Near:     Physical Exam Vitals and nursing note reviewed.  Constitutional:      General: He is not in acute distress.    Appearance: He is well-developed.  He is obese. He is not ill-appearing or toxic-appearing.  HENT:     Head: Normocephalic and atraumatic.  Cardiovascular:     Rate and Rhythm: Normal rate and regular rhythm.     Heart sounds: Normal heart sounds.  Pulmonary:     Effort: Pulmonary effort is normal.     Breath sounds: Normal breath sounds.  Abdominal:     General: A surgical scar is present. Bowel sounds are normal.     Palpations: Abdomen is soft.     Tenderness: There is generalized abdominal tenderness and tenderness in the epigastric area.     Hernia: A hernia is present. Hernia is present in the ventral area.  Skin:    General: Skin is warm and dry.  Neurological:     General: No focal deficit present.     Mental Status: He is alert and oriented to person, place, and time.  Psychiatric:        Mood and Affect: Mood normal.        Behavior: Behavior normal.      UC Treatments / Results  Labs (all labs ordered are listed, but only abnormal results are displayed) Labs Reviewed  COMPREHENSIVE METABOLIC PANEL - Abnormal; Notable for the following components:      Result Value   Total Bilirubin 1.4 (*)    All other components within normal limits  CBC WITH DIFFERENTIAL/PLATELET - Abnormal; Notable for the following components:   RBC 5.96 (*)    Hemoglobin 17.5 (*)    All other components within normal limits  LIPASE, BLOOD    EKG   Radiology No results found.  Procedures Procedures (including critical care time)  Medications Ordered in UC Medications - No data to display  Initial Impression / Assessment and Plan / UC Course  I have reviewed the triage vital signs and the nursing notes.  Pertinent labs & imaging results that were available during my care of the patient were reviewed by me and considered in my medical decision making (see chart for details).   49 year old male presents with history of epigastric discomfort a fluttering sensation at times.  It is intermittent will come and go  without provocation.  He does not related specifically to food but is a poor historian in this regard.  He does admit that Tums did give him temporary relief as does  mentholated cough drops.  Recently underwent an extensive evaluation at [facility] in July 2021 for very similar presenting symptoms.  That work-up was essentially negative.  Was encouraged to see his cardiologist but due to insurance lapsing he has not.  Physical exam showed tenderness to palpation in the epigastric region and less so having generalized abdominal pain.  There is no rigidity there is no rebound present.  He had normal bowel sounds.  Does have a ventral hernia from previous surgery.  Laboratory was obtained with a CBC CMP and lipase all of which were normal.  I suspect that he may have a Oestreich/peptic ulcer or GERD.  Start him on Protonix 40 mg daily.  I have recommended highly that he follow-up with a gastroenterologist along with the cardiologist as soon as his insurance is enforced.  If he has worsening of his symptoms he should go to the emergency room.   Final Clinical Impressions(s) / UC Diagnoses   Final diagnoses:  Abdominal pain, chronic, epigastric     Discharge Instructions     Recommend following up with gastroenterologist as soon as possible.  Also need to consider a cardiologist as per recommendations from [facility] emergency room in July.  Try not to eat prior to lying down and going to bed.  Prop the head of your bed up around 15degrees.    ED Prescriptions    Medication Sig Dispense Auth. Provider   pantoprazole (PROTONIX) 40 MG tablet Take 1 tablet (40 mg total) by mouth daily. 30 tablet Lutricia Feil, PA-C     PDMP not reviewed this encounter.   Lutricia Feil, PA-C 11/27/19 1711

## 2019-11-27 NOTE — Discharge Instructions (Addendum)
Recommend following up with gastroenterologist as soon as possible.  Also need to consider a cardiologist as per recommendations from [facility] emergency room in July.  Try not to eat prior to lying down and going to bed.  Prop the head of your bed up around 15degrees.

## 2019-11-27 NOTE — ED Triage Notes (Signed)
Patient complains of abdominal upper Gi flutter. States that he had a full cardiac workup last month for the same thing. States that he does notice a cough when this happens. Patient states that his upper abdomen feels "bruised". Patient states that he has a history of reflux and bronchitis. States that this occurs at random time only lasting for a few seconds but states that last night it lasted for 30 mins. Reports that occurs mostly only when he lays down.

## 2019-12-19 ENCOUNTER — Other Ambulatory Visit: Payer: Self-pay

## 2019-12-19 ENCOUNTER — Ambulatory Visit
Admission: EM | Admit: 2019-12-19 | Discharge: 2019-12-19 | Disposition: A | Discharge status: Home / Self Care | Payer: Self-pay | Attending: Family Medicine | Admitting: Family Medicine

## 2019-12-19 DIAGNOSIS — J069 Acute upper respiratory infection, unspecified: Secondary | ICD-10-CM | POA: Insufficient documentation

## 2019-12-19 DIAGNOSIS — Z20822 Contact with and (suspected) exposure to covid-19: Secondary | ICD-10-CM | POA: Insufficient documentation

## 2019-12-19 MED ORDER — CETIRIZINE-PSEUDOEPHEDRINE ER 5-120 MG PO TB12: 1.0000 | ORAL_TABLET | Freq: Two times a day (BID) | ORAL | 0 refills | Status: AC

## 2019-12-19 MED ORDER — IPRATROPIUM BROMIDE 0.06 % NA SOLN: 2.0000 | Freq: Four times a day (QID) | NASAL | 0 refills | Status: AC | PRN

## 2019-12-19 NOTE — ED Provider Notes (Signed)
[facility]    CSN: [id number] Arrival date & time: 12/19/19  1207      History   Chief Complaint Chief Complaint  Patient presents with  . Cough   HPI  49 year old male presents with respiratory symptoms.  2-day history of postnasal drip, runny nose, sneezing.  He has had some cough as well.  No fever.  He has had sick contacts.  Desires Covid testing today.  No relieving factors.  No other associated symptoms.  No other complaints.  Past Medical History:  Diagnosis Date  . Asthma   . Biceps muscle tear   . MRSA (methicillin resistant Staphylococcus aureus) carrier    Infection rt leg  . Sciatic nerve pain    Past Surgical History:  Procedure Laterality Date  . APPENDECTOMY    . COLON SURGERY    . FOOT SURGERY Right    Foot almost amputated  . KNEE ARTHROSCOPY W/ DEBRIDEMENT Right        Home Medications    Prior to Admission medications   Medication Sig Start Date End Date Taking? Authorizing Provider  pantoprazole (PROTONIX) 40 MG tablet Take 1 tablet (40 mg total) by mouth daily. 11/27/19  Yes Lutricia Feil, PA-C  cetirizine-pseudoephedrine (ZYRTEC-D) 5-120 MG tablet Take 1 tablet by mouth 2 (two) times daily. 12/19/19   Everlene Other G, DO  ipratropium (ATROVENT) 0.06 % nasal spray Place 2 sprays into both nostrils 4 (four) times daily as needed for rhinitis. 12/19/19   Tommie Sams, DO    Family History Family History  Problem Relation Age of Onset  . Healthy Mother   . Heart disease Father     Social History Social History   Tobacco Use  . Smoking status: Current Every Day Smoker    Packs/day: 0.50    Types: Cigarettes  . Smokeless tobacco: Never Used  Vaping Use  . Vaping Use: Never used  Substance Use Topics  . Alcohol use: No  . Drug use: No     Allergies   Clarithromycin, Codeine, Bee venom, Morphine and related, and Latex   Review of Systems Review of Systems Per HPI  Physical Exam Triage Vital Signs ED  Triage Vitals  Enc Vitals Group     BP 12/19/19 1346 (!) 136/96     Pulse Rate 12/19/19 1346 73     Resp 12/19/19 1346 18     Temp 12/19/19 1346 98.2 F (36.8 C)     Temp Source 12/19/19 1346 Oral     SpO2 12/19/19 1346 99 %     Weight 12/19/19 1344 244 lb 14.9 oz (111.1 kg)     Height 12/19/19 1344 6' (1.829 m)     Head Circumference --      Peak Flow --      Pain Score 12/19/19 1344 10     Pain Loc --      Pain Edu? --      Excl. in GC? --    Updated Vital Signs BP (!) 136/96 (BP Location: Right Arm)   Pulse 73   Temp 98.2 F (36.8 C) (Oral)   Resp 18   Ht 6' (1.829 m)   Wt 111.1 kg   SpO2 99%   BMI 33.22 kg/m   Visual Acuity Right Eye Distance:   Left Eye Distance:   Bilateral Distance:    Right Eye Near:   Left Eye Near:    Bilateral Near:     Physical Exam Constitutional:  Appearance: Normal appearance.  HENT:     Head: Normocephalic and atraumatic.     Right Ear: Tympanic membrane normal.     Left Ear: Tympanic membrane normal.     Nose: Congestion present.  Eyes:     General:        Right eye: No discharge.        Left eye: No discharge.     Conjunctiva/sclera: Conjunctivae normal.  Cardiovascular:     Rate and Rhythm: Normal rate and regular rhythm.  Pulmonary:     Effort: Pulmonary effort is normal.     Breath sounds: Normal breath sounds. No wheezing, rhonchi or rales.  Neurological:     Mental Status: He is alert.  Psychiatric:        Mood and Affect: Mood normal.        Behavior: Behavior normal.    UC Treatments / Results  Labs (all labs ordered are listed, but only abnormal results are displayed) Labs Reviewed  SARS CORONAVIRUS 2 (TAT 6-24 HRS)    EKG   Radiology No results found.  Procedures Procedures (including critical care time)  Medications Ordered in UC Medications - No data to display  Initial Impression / Assessment and Plan / UC Course  I have reviewed the triage vital signs and the nursing  notes.  Pertinent labs & imaging results that were available during my care of the patient were reviewed by me and considered in my medical decision making (see chart for details).    49 year old male presents with a viral URI.  Possible COVID-19.  Awaiting test result.  Treating with Atrovent nasal spray and Zyrtec-D.  Work note given.  Final Clinical Impressions(s) / UC Diagnoses   Final diagnoses:  Viral URI  Encounter for laboratory testing for COVID-19 virus     Discharge Instructions     Rest. Fluids.  Medication as directed.  Take care  Dr. Adriana Simas    ED Prescriptions    Medication Sig Dispense Auth. Provider   ipratropium (ATROVENT) 0.06 % nasal spray Place 2 sprays into both nostrils 4 (four) times daily as needed for rhinitis. 15 mL Obi Scrima G, DO   cetirizine-pseudoephedrine (ZYRTEC-D) 5-120 MG tablet Take 1 tablet by mouth 2 (two) times daily. 30 tablet Tommie Sams, DO     PDMP not reviewed this encounter.   Tommie Sams, [place] 12/19/19 1813

## 2019-12-19 NOTE — ED Triage Notes (Signed)
Patient complains of cough, sinus pain and pressure x 2 days. Patient states that his nephews are all sick and they were around each other. Would like covid testing.

## 2019-12-19 NOTE — Discharge Instructions (Signed)
Rest.   Fluids.  Medication as directed.  Take care  Dr. Louiza Moor  

## 2019-12-20 LAB — SARS CORONAVIRUS 2 (TAT 6-24 HRS): SARS Coronavirus 2: NEGATIVE

## 2019-12-24 ENCOUNTER — Encounter: Payer: Self-pay | Admitting: Emergency Medicine

## 2019-12-24 ENCOUNTER — Ambulatory Visit
Admission: EM | Admit: 2019-12-24 | Discharge: 2019-12-24 | Disposition: A | Discharge status: Home / Self Care | Payer: Self-pay | Attending: Emergency Medicine | Admitting: Emergency Medicine

## 2019-12-24 ENCOUNTER — Other Ambulatory Visit: Payer: Self-pay

## 2019-12-24 DIAGNOSIS — J329 Chronic sinusitis, unspecified: Secondary | ICD-10-CM

## 2019-12-24 MED ORDER — BENZONATATE 100 MG PO CAPS: 200.0000 mg | ORAL_CAPSULE | Freq: Three times a day (TID) | ORAL | 0 refills | Status: AC

## 2019-12-24 MED ORDER — DOXYCYCLINE HYCLATE 100 MG PO CAPS: 100.0000 mg | ORAL_CAPSULE | Freq: Two times a day (BID) | ORAL | 0 refills | Status: AC

## 2019-12-24 MED ORDER — PROMETHAZINE-DM 6.25-15 MG/5ML PO SYRP: 5.0000 mL | ORAL_SOLUTION | Freq: Four times a day (QID) | ORAL | 0 refills | Status: AC | PRN

## 2019-12-24 NOTE — ED Triage Notes (Signed)
Patient states that he was seen here on 12/19/19 with cold symptoms and his COVID test was negative.  Patient states that his sinus congestion and drainage and cough has not improved.  Patient denies fevers.

## 2019-12-24 NOTE — Discharge Instructions (Addendum)
Take the doxycycline twice a day with food for 10 days.  Biotic 1 hour after each dose of antibiotic to avoid diarrhea.  Wear sunscreen when outdoors because this will make you more prone to sunburn.  Use the Tessalon Perles every 8 hours as needed for cough and the promethazine at bedtime for cough and sleep.  Perform sinus irrigation 2-3 times a day with distilled water and a NeilMed sinus rinse kit.  You can acquire the kit at any pharmacy or grocery store.  You need to replace the bottle every 90 days.  You can heat sterilize the bottle between uses by separating theand bottle, placing them on a pie plate, and putting them in the microwave for 90 seconds.  Allow them to cool before use.  Take over-the-counter Mucinex, not Mucinex D, according to the package directions and increase your water intake to a goal of 1 gallon a day.  If your symptoms not improving follow-up with your primary care doctor.

## 2019-12-24 NOTE — ED Provider Notes (Signed)
[facility]    CSN: [id number] Arrival date & time: 12/24/19  1353      History   Chief Complaint Chief Complaint  Patient presents with  . Sinus Problem  . Cough    HPI Dean Pierce is a 49 y.o. male.   49 year old male presents for evaluation of continued nasal congestion, sinus pain and pressure, postnasal drip, chest congestion, cough, and wheezing.  Patient also reports that he has a headache every morning when he wakes up due to the sinus pressure.  His cough increases when he lays down.  Patient is a smoker.  Patient denies shortness of breath, fever, nausea, vomiting, or diarrhea.     Past Medical History:  Diagnosis Date  . Asthma   . Biceps muscle tear   . MRSA (methicillin resistant Staphylococcus aureus) carrier    Infection rt leg  . Sciatic nerve pain     There are no problems to display for this patient.   Past Surgical History:  Procedure Laterality Date  . APPENDECTOMY    . COLON SURGERY    . FOOT SURGERY Right    Foot almost amputated  . KNEE ARTHROSCOPY W/ DEBRIDEMENT Right        Home Medications    Prior to Admission medications   Medication Sig Start Date End Date Taking? Authorizing Provider  cetirizine-pseudoephedrine (ZYRTEC-D) 5-120 MG tablet Take 1 tablet by mouth 2 (two) times daily. 12/19/19  Yes Cook, Jayce G, DO  ipratropium (ATROVENT) 0.06 % nasal spray Place 2 sprays into both nostrils 4 (four) times daily as needed for rhinitis. 12/19/19  Yes Cook, Jayce G, DO  pantoprazole (PROTONIX) 40 MG tablet Take 1 tablet (40 mg total) by mouth daily. 11/27/19  Yes Lorin Picket, PA-C  benzonatate (TESSALON) 100 MG capsule Take 2 capsules (200 mg total) by mouth every 8 (eight) hours. 12/24/19   Margarette Canada, NP  doxycycline (VIBRAMYCIN) 100 MG capsule Take 1 capsule (100 mg total) by mouth 2 (two) times daily. 12/24/19   Margarette Canada, NP  promethazine-dextromethorphan (PROMETHAZINE-DM) 6.25-15 MG/5ML syrup Take 5  mLs by mouth 4 (four) times daily as needed. 12/24/19   Margarette Canada, NP    Family History Family History  Problem Relation Age of Onset  . Healthy Mother   . Heart disease Father     Social History Social History   Tobacco Use  . Smoking status: Current Every Day Smoker    Packs/day: 0.50    Types: Cigarettes  . Smokeless tobacco: Never Used  Vaping Use  . Vaping Use: Never used  Substance Use Topics  . Alcohol use: No  . Drug use: No     Allergies   Clarithromycin, Codeine, Bee venom, Morphine and related, and Latex   Review of Systems Review of Systems  Constitutional: Negative for activity change, appetite change and fever.  HENT: Positive for congestion, postnasal drip, rhinorrhea, sinus pressure and sinus pain. Negative for ear discharge, ear pain and sore throat.        Patient reports thick nasal discharge.  He reports most of his sinus pain and pressure is in his frontal sinus.  Respiratory: Positive for shortness of breath and wheezing. Negative for cough.        Patient has a productive cough for clear sputum.  Cardiovascular: Negative for chest pain.  Gastrointestinal: Negative for diarrhea, nausea and vomiting.  Genitourinary: Negative for dysuria and frequency.  Musculoskeletal: Negative for arthralgias and myalgias.  Skin: Negative  for rash.  Neurological: Positive for headaches. Negative for syncope.  Hematological: Negative.   Psychiatric/Behavioral: Negative.      Physical Exam Triage Vital Signs ED Triage Vitals  Enc Vitals Group     BP 12/24/19 1419 (!) 150/89     Pulse Rate 12/24/19 1419 (!) 106     Resp 12/24/19 1419 16     Temp 12/24/19 1419 98.6 F (37 C)     Temp Source 12/24/19 1419 Oral     SpO2 12/24/19 1419 98 %     Weight 12/24/19 1415 244 lb 14.9 oz (111.1 kg)     Height 12/24/19 1415 6' (1.829 m)     Head Circumference --      Peak Flow --      Pain Score 12/24/19 1415 4     Pain Loc --      Pain Edu? --      Excl. in  [place]? --    No data found.  Updated Vital Signs BP (!) 150/89 (BP Location: Right Arm)   Pulse (!) 106   Temp 98.6 F (37 C) (Oral)   Resp 16   Ht 6' (1.829 m)   Wt 244 lb 14.9 oz (111.1 kg)   SpO2 98%   BMI 33.22 kg/m   Visual Acuity Right Eye Distance:   Left Eye Distance:   Bilateral Distance:    Right Eye Near:   Left Eye Near:    Bilateral Near:     Physical Exam Vitals and nursing note reviewed.  Constitutional:      General: He is not in acute distress.    Appearance: Normal appearance. He is not toxic-appearing.  HENT:     Head: Normocephalic and atraumatic.     Right Ear: Tympanic membrane, ear canal and external ear normal. There is no impacted cerumen.     Left Ear: Tympanic membrane, ear canal and external ear normal. There is no impacted cerumen.     Nose: Congestion and rhinorrhea present.     Comments: Nasal mucosa is erythematous, and edematous.  Discharge is a thick yellow-green.  Patient has marked tenderness to his frontal sinuses.  Maxillary sinuses are also tender to percussion.    Mouth/Throat:     Mouth: Mucous membranes are moist.     Pharynx: Oropharynx is clear. Posterior oropharyngeal erythema present. No oropharyngeal exudate.     Comments: There is some mild erythema and clear postnasal drip in the posterior oropharynx. Eyes:     General: No scleral icterus.    Extraocular Movements: Extraocular movements intact.     Conjunctiva/sclera: Conjunctivae normal.     Pupils: Pupils are equal, round, and reactive to light.  Cardiovascular:     Rate and Rhythm: Normal rate and regular rhythm.     Pulses: Normal pulses.     Heart sounds: Normal heart sounds. No murmur heard.  No gallop.   Pulmonary:     Effort: Pulmonary effort is normal. No respiratory distress.     Breath sounds: Normal breath sounds. No wheezing, rhonchi or rales.  Musculoskeletal:        General: No swelling or tenderness. Normal range of motion.     Cervical back: Normal  range of motion and neck supple. No tenderness.  Lymphadenopathy:     Cervical: No cervical adenopathy.  Skin:    General: Skin is warm.     Capillary Refill: Capillary refill takes less than 2 seconds.     Findings: No bruising,  erythema or rash.  Neurological:     General: No focal deficit present.     Mental Status: He is alert.  Psychiatric:        Mood and Affect: Mood normal.        Behavior: Behavior normal.        Thought Content: Thought content normal.        Judgment: Judgment normal.      UC Treatments / Results  Labs (all labs ordered are listed, but only abnormal results are displayed) Labs Reviewed - No data to display  EKG   Radiology No results found.  Procedures Procedures (including critical care time)  Medications Ordered in UC Medications - No data to display  Initial Impression / Assessment and Plan / UC Course  I have reviewed the triage vital signs and the nursing notes.  Pertinent labs & imaging results that were available during my care of the patient were reviewed by me and considered in my medical decision making (see chart for details).    Patient has been experiencing a worsening of symptoms.  He was evaluated for the same symptoms on 12/19/2019 and was tested for Covid at that time.  His Covid test was negative.  Patient continues to have sinus pain and pressure, thick nasal discharge, and a cough that is worse at night.  He has had some wheezing off and on but denies shortness of breath.  He is a smoker.  The cough is also worse when he lays down.  Lung sounds are clear to auscultation all fields.  Suspect that patient has chronic subacute sinusitis secondary to being a smoker.  Will cover with doxycycline twice daily, will give Tessalon Perles and Promethazine DM for cough.  We will have patient follow-up with his primary care doctor if no improvement.   Final Clinical Impressions(s) / UC Diagnoses   Final diagnoses:  Chronic  sinusitis, unspecified location     Discharge Instructions     Take the doxycycline twice a day with food for 10 days.  Biotic 1 hour after each dose of antibiotic to avoid diarrhea.  Wear sunscreen when outdoors because this will make you more prone to sunburn.  Use the Tessalon Perles every 8 hours as needed for cough and the promethazine at bedtime for cough and sleep.  Perform sinus irrigation 2-3 times a day with distilled water and a NeilMed sinus rinse kit.  You can acquire the kit at any pharmacy or grocery store.  You need to replace the bottle every 90 days.  You can heat sterilize the bottle between uses by separating theand bottle, placing them on a pie plate, and putting them in the microwave for 90 seconds.  Allow them to cool before use.  Take over-the-counter Mucinex, not Mucinex D, according to the package directions and increase your water intake to a goal of 1 gallon a day.  If your symptoms not improving follow-up with your primary care doctor.    ED Prescriptions    Medication Sig Dispense Auth. Provider   benzonatate (TESSALON) 100 MG capsule Take 2 capsules (200 mg total) by mouth every 8 (eight) hours. 21 capsule Margarette Canada, NP   promethazine-dextromethorphan (PROMETHAZINE-DM) 6.25-15 MG/5ML syrup Take 5 mLs by mouth 4 (four) times daily as needed. 118 mL Margarette Canada, NP   doxycycline (VIBRAMYCIN) 100 MG capsule Take 1 capsule (100 mg total) by mouth 2 (two) times daily. 20 capsule Margarette Canada, NP     PDMP not reviewed  this encounter.   Margarette Canada, NP 12/24/19 [phone]

## 2019-12-30 IMAGING — CR DG CHEST 2V
2 series · 2 of 2 positions shown · non-contrast
Comparison: None.

:
No acute cardiopulmonary process.
CLINICAL DATA: Pt Vakho Vil bad cough with chest soreness from
coughing so much. Neg covid test 4 days ago. Current smoker, hx
pneumoniaworsening cough

EXAM:
CHEST - 2 VIEW

[chest pa]
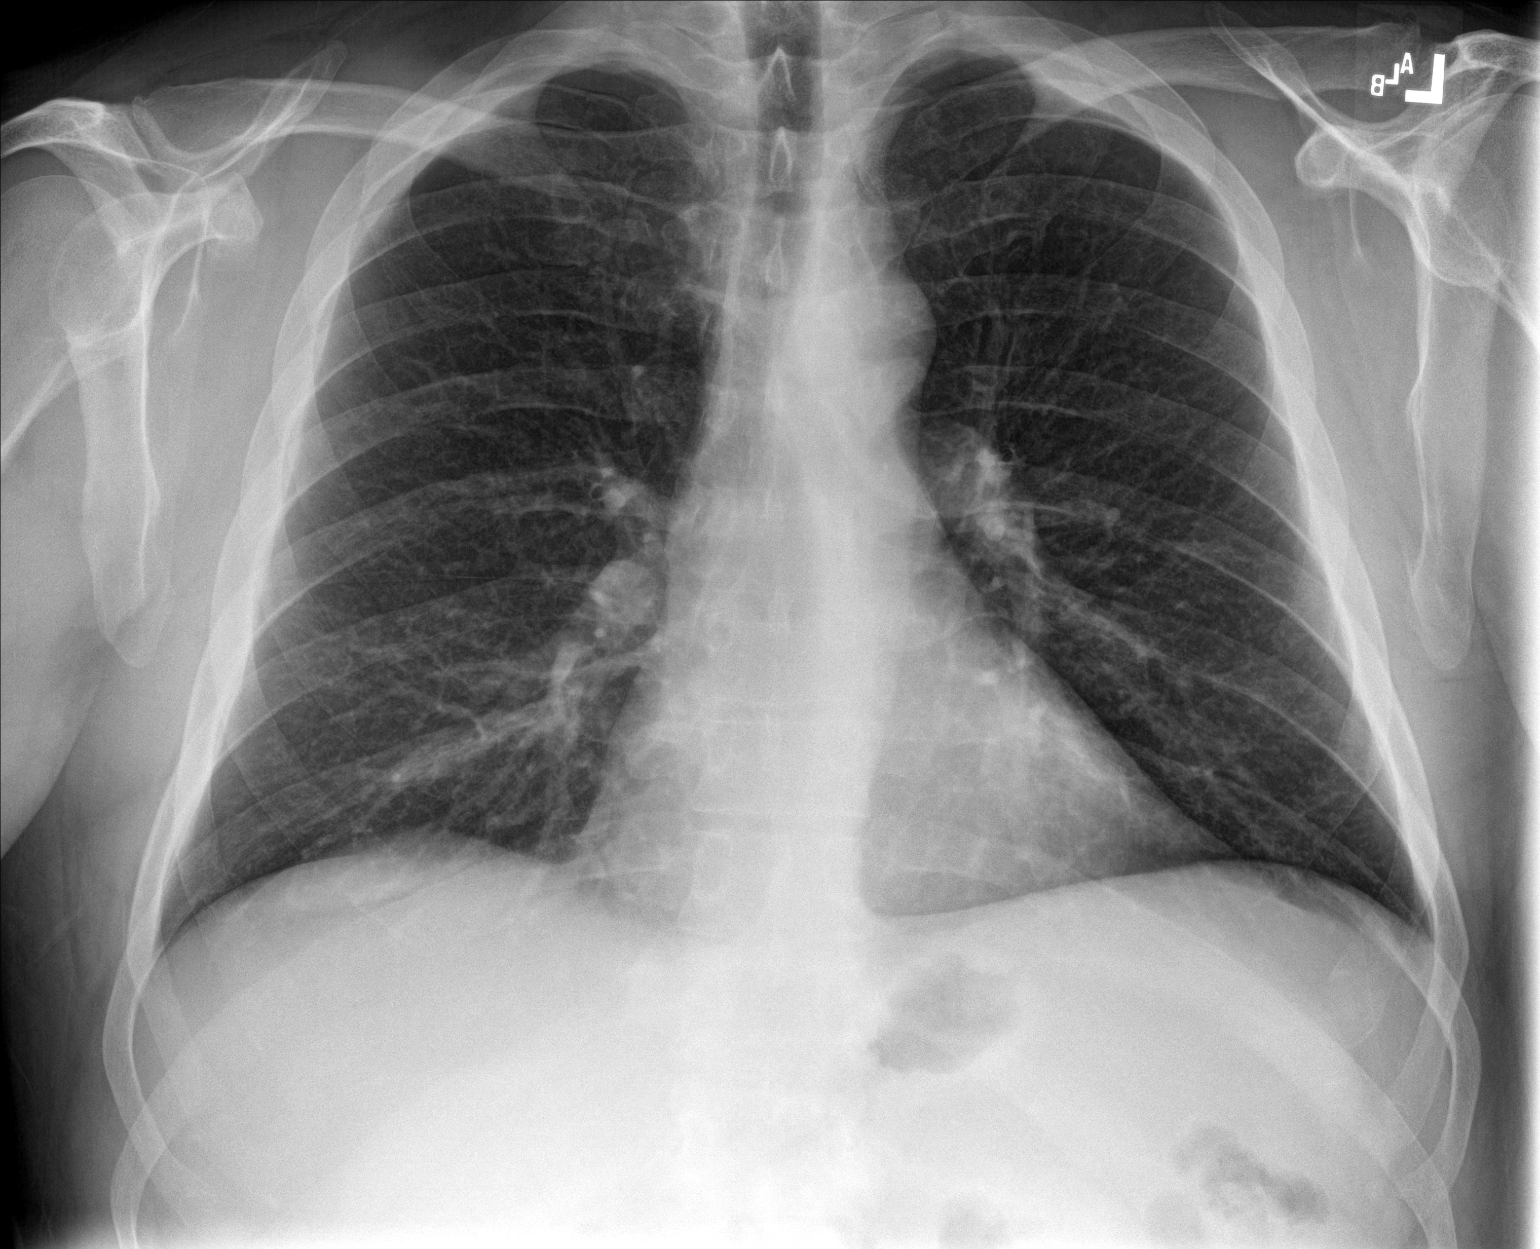

[chest lat]
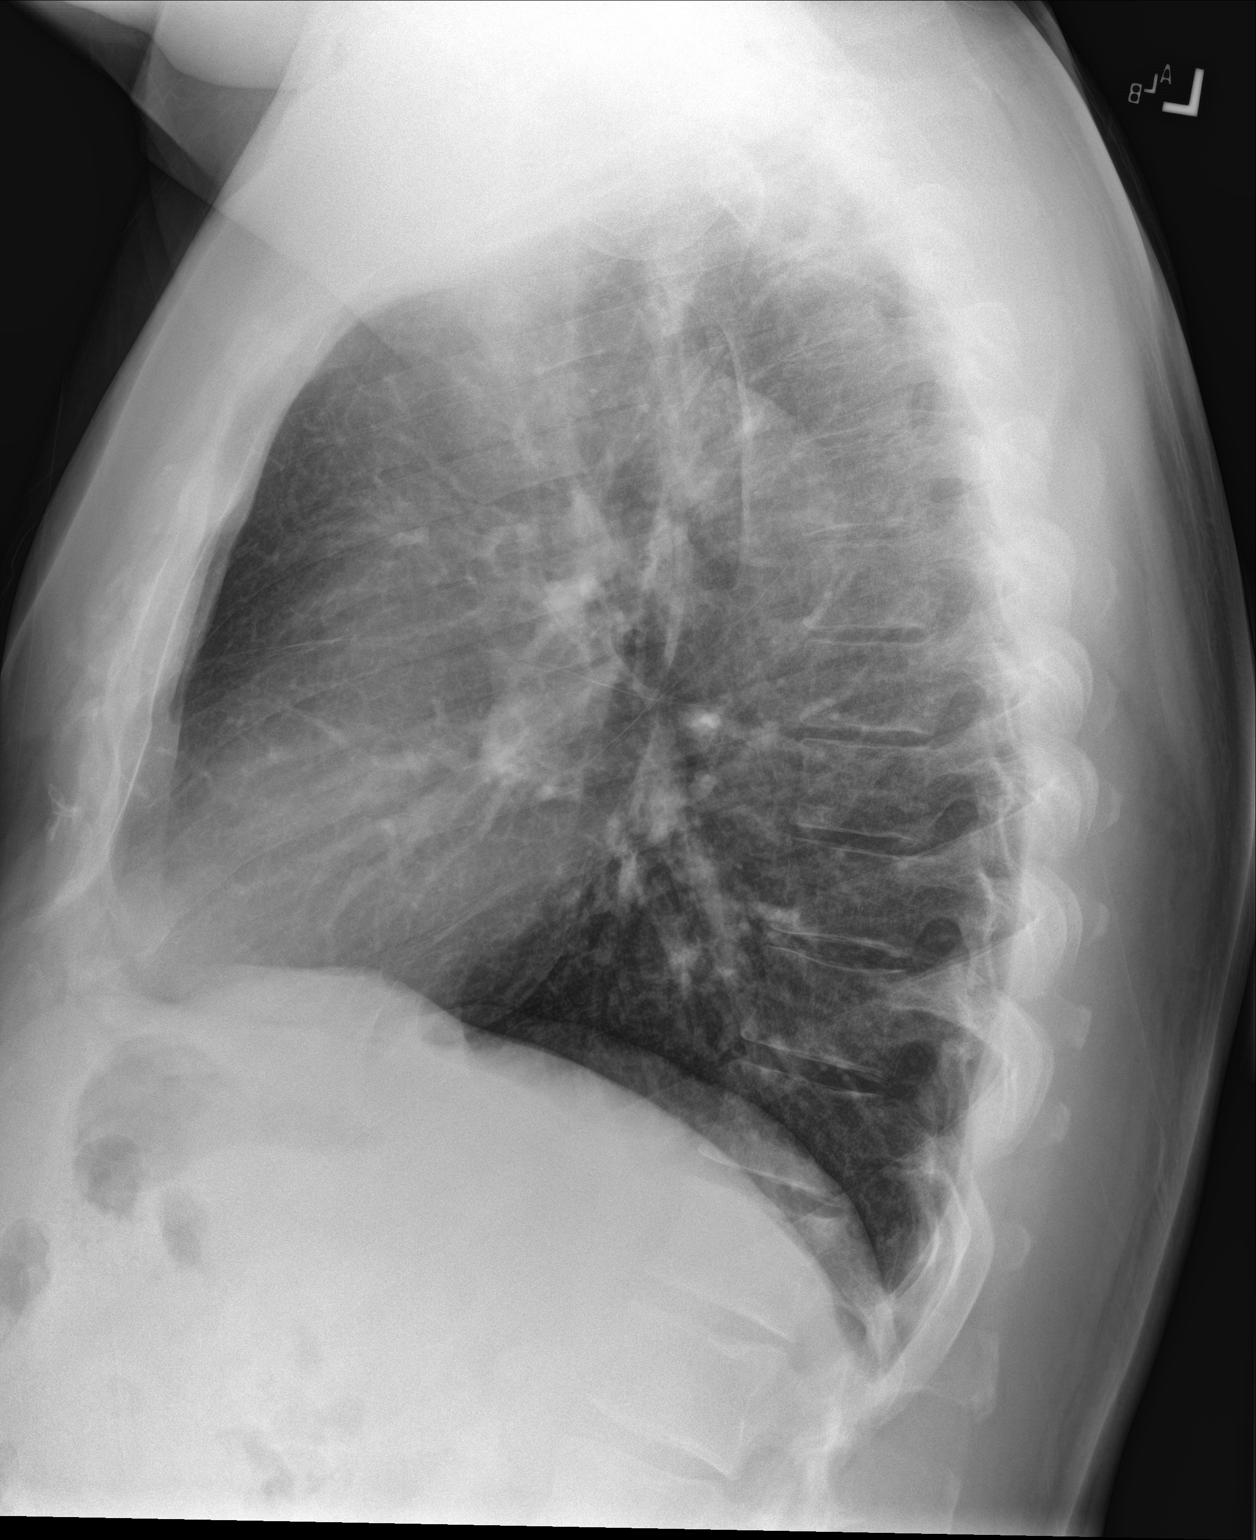

[2 of 2 positions shown; findings below may reference images not displayed]

FINDINGS: Normal mediastinum and cardiac silhouette. Normal pulmonary
vasculature. No evidence of effusion, infiltrate, or pneumothorax.
No acute bony abnormality.
IMPRESSION: No acute cardiopulmonary process.

## 2021-07-15 ENCOUNTER — Ambulatory Visit
Admit: 2021-07-15 | Discharge: 2021-07-15 | Payer: BLUE CROSS/BLUE SHIELD | Attending: Physician Assistant | Primary: Family

## 2021-07-15 DIAGNOSIS — M5416 Radiculopathy, lumbar region: Secondary | ICD-10-CM

## 2021-07-15 MED ORDER — GABAPENTIN 300 MG PO CAPS
300 MG | ORAL_CAPSULE | ORAL | 0 refills | Status: AC
Start: 2021-07-15 — End: 2021-08-15

## 2021-07-15 NOTE — Progress Notes (Unsigned)
Joe Barker   DOB 1971/01/23     Patient presents today with complaints of   Symptoms presented   How did your current pain start?   Describes pain as   Pain increases with   Pain decreases with   No change in pain with   Pain level today is   Patient admits/denies numbness, tingling, weakness, radiating pain.   Patient admits/denies recent Conservative Treatment (past 6 months) Physical therapy, Chiropractic Care, Injections, Massage Therapy, HEP, Medications.  Patient admits/denies previous spinal surgery.

## 2021-07-25 ENCOUNTER — Inpatient Hospital Stay: Payer: BLUE CROSS/BLUE SHIELD | Primary: Family

## 2021-07-25 DIAGNOSIS — M5416 Radiculopathy, lumbar region: Secondary | ICD-10-CM

## 2021-07-30 ENCOUNTER — Encounter

## 2021-07-30 ENCOUNTER — Ambulatory Visit
Admit: 2021-07-30 | Discharge: 2021-07-30 | Payer: BLUE CROSS/BLUE SHIELD | Attending: Physician Assistant | Primary: Family

## 2021-07-30 ENCOUNTER — Inpatient Hospital Stay: Admit: 2021-07-30 | Primary: Family

## 2021-07-30 DIAGNOSIS — M461 Sacroiliitis, not elsewhere classified: Secondary | ICD-10-CM

## 2021-07-30 DIAGNOSIS — R69 Illness, unspecified: Secondary | ICD-10-CM

## 2021-07-30 NOTE — Progress Notes (Unsigned)
Joe Barker  DOB 1971/02/24     LOV 07/15/2021 Patient is a 51 y.o. male presenting for evaluation of acute on chronic low back pain and left leg pain x 3 weeks. Pain radiates to the left anterior thigh with tingling and numbness. Lumbar CT scan demonstrated mild DDD at L3-4, mild facet arthropathy at L3-S1, and mild b/l foraminal narrowing at L3-4. He has been taking Flexeril, Percocet, and prednisone for the pain and reports minimal relief. On exam today he has a sensory deficit in the L3 and L4 dermatomes on the left along with positive SLR and absent patellar reflexes. Plan today is to order lumbar MRI to evaluate for neural compression. Consider EMG/NCS of the LLE in the future. I will send in gabapentin for him today. He has an appointment with his PCP on 07/22/21 to discuss work status. I will place him on sedentary work restrictions until then. 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

## 2021-07-30 NOTE — Telephone Encounter (Signed)
L SI BHMOB 6/14  GAVE PPW

## 2021-08-08 NOTE — Telephone Encounter (Signed)
Pt states that he spoke with Vicente Males from billing and/or [employer]. Pt has lost his insurance due to losing his job from back issues. Pt states that he will be billed for his injection & in the meantime he is applying for [facility]. This person sent him the PPW. Vicente Males told pt not to cancel his injection appt.

## 2021-08-14 ENCOUNTER — Ambulatory Visit: Admit: 2021-08-14 | Discharge: 2021-08-14 | Payer: BLUE CROSS/BLUE SHIELD | Attending: Anesthesiology | Primary: Family

## 2021-08-14 DIAGNOSIS — M533 Sacrococcygeal disorders, not elsewhere classified: Secondary | ICD-10-CM

## 2021-08-14 NOTE — Progress Notes (Signed)
Date: 08/14/21     DX: Sacroiliitis    PROCEDURE: Left SI joint injection      Patient is here for a therapeutic pain procedure with fluoroscopy.    Vitals: See nursing notes    Mental status: Alert and oriented x3    Cardiovascular: Regular rate and rhythm    Lungs: Clear to auscultation bilaterally    Neuro: Nonfocal    Assessment:  Patient here for therapeutic pain procedure.     Plan:  Will plan to proceed with scheduled pain procedure        Rondall Allegra MD

## 2021-08-14 NOTE — Progress Notes (Signed)
Date: 08/14/21     Referred by: Heather Roberts     Diagnosis: Sacroiliitis    Procedure: Sacroiliac joint injection    Side: Left      Procedure Note: The patient comes to the [facility]. [facility] referred for a sacroiliac injection. The patient's major complaint is that of pain around the SI joint.  After a pre-block discussion with the patient explaining the procedure, mechanism of action, hoped for benefit and rick involved including the risk of even permanent nerve damage or paralysis, informed consent was obtained. The patient was taken to the block suite and placed in a prone position. We then sterilely prepped and draped the area above the injection site. Using 2 cc's of 1% Xylocaine a skin wheal and the subcutaneous tissues were infiltrated for a good local anesthetic effect. Then using an 25 ga spinal needle I introduced to the selected sacroiliac joint guided by biplanar fluoroscopy. No blood was encountered. Then 80 mg of Depo-Medrol mixed in 5cc of .25% Marcaine was injected into the sacroiliac joint.  The patient is resting in the post block suite and understands to follow up as directed.      Rondall Allegra MD

## 2021-08-19 NOTE — Telephone Encounter (Signed)
Pt called stating he lost his insurance so he is trying to get [place] [employer] and he would like to be sent to someone in [place] for his EMG so when he gets approved it will be covered. Pt would like a callback at  [phone]

## 2021-08-20 NOTE — Telephone Encounter (Signed)
The referral has been redirected.

## 2021-08-28 ENCOUNTER — Ambulatory Visit
Admit: 2021-08-28 | Discharge: 2021-08-28 | Payer: BLUE CROSS/BLUE SHIELD | Attending: Physician Assistant | Primary: Family

## 2021-08-28 DIAGNOSIS — M5416 Radiculopathy, lumbar region: Secondary | ICD-10-CM

## 2021-08-28 NOTE — Progress Notes (Signed)
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 MRI at [facility] on 07/26/21 demonstrated disc bulge at L3-4 without central or foraminal stenosis. His physical exam today is consistent with SI joint pain. I will refer for a left SI joint injection today. I will also order an EMG/NCS of the LLE to evaluate his complaint of left thigh numbness. He will follow-up 2 weeks after his SI injection. He is here today to discuss the results.     Left SI injection on 08/14/2021 with 50-60% of relief lasting for four days   Pain level today is 8/10       Joe Barker   DOB:07/24/1970     Reason for visit:   Chief Complaint   Patient presents with    Follow-up        History of present illness: Patient is a 51 y.o. male presenting for injection follow-up. He had a left SI injection on 08/14/2021 with 50-60% relief lasting for four days. He states that the injection helped with some of his burning pain but he continues to experience pain in the low back and numbness in the left thigh. He has not yet scheduled his EMG/NCS yet.     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 He reports pain is unchanged from his last visit. He completed an MRI at [facility] on 07/26/21. He is here today to discuss the results.      Office visit 07/15/21: Patient is a 51 y.o. male presenting for evaluation of acute on chronic low back pain and left leg pain. He states pain originated after a work injury in 2010 but acutely worsened about 3 weeks ago after starting a new job which requires a significant amount of heavy lifting. He states his pain radiates to the left anterior thigh with tingling and numbness. He reports leg weakness. Denies bowel or bladder incontinence and saddle anesthesia. He was seen in the [facility] about 3 weeks ago where a lumbar CT scan  demonstrated mild DDD at L3-4, mild facet arthropathy at L3-S1, and mild b/l foraminal narrowing at L3-4. He has been taking Flexeril, Percocet, and prednisone for the pain and reports minimal relief. He denies recent injections or PT for low back pain. Denies previous spine surgery.    ROS:   A complete ROS based on the patients complaint was obtained today and appropriately  documented in the HPI.    Allergies   Allergen Reactions    Adhesive Tape     Morphine     Sulfites        Past Medical History:   Diagnosis Date    MRSA (methicillin resistant staph aureus) culture positive         Past Surgical History:   Procedure Laterality Date    KNEE SURGERY      STOMACH SURGERY           Current Outpatient Medications:     ibuprofen (ADVIL;MOTRIN) 800 MG tablet, Take 1 tablet by mouth every 6 hours as needed for Pain, Disp: , Rfl:     cyclobenzaprine (FLEXERIL) 10 MG tablet, TAKE 1 TABLET BY MOUTH THREE TIMES A DAY AS NEEDED FOR MUSCLE SPASMS (Patient not taking: Reported on 08/14/2021), Disp: , Rfl:     oxyCODONE-acetaminophen (  PERCOCET) 5-325 MG per tablet, Take 1 tablet by mouth every 6 hours as needed. (Patient not taking: Reported on 08/14/2021), Disp: , Rfl:     predniSONE (DELTASONE) 20 MG tablet, TAKE 3TABS/DAY X 2 DAYS, 2TABS/DAY X 2 DAYS, 1TAB/DAY X 2 DAYS, 1/2 TAB/DAYX 2 DAYS (Patient not taking: Reported on 07/15/2021), Disp: , Rfl:     gabapentin (NEURONTIN) 300 MG capsule, Take 1 capsule by mouth QHS X 5 days, take 1 capsule by mouth BID X 5 days then take 1 capsule by mouth TID to continue.  Hold for drowsiness or dizziness., Disp: 90 capsule, Rfl: 0       Physical Exam:  Patient seen and examined   General: Well developed. Alert and cooperative in no acute distress.     HENT: atraumatic, neck supple  Pulmonary: unlabored respiratory effort  Cardiovascular:  Warm well perfused. No peripheral edema    Neurologic Exam:  Neurological:  Mental Status: Awake, alert, oriented x 4, speech clear and  appropriate  Attention: Intact    Musculoskeletal:   Gait: WNL  Assist devices: None       Radiological Findings:  None since last visit    Assessment:   Diagnosis Orders   1. Lumbar radiculopathy  Ambulatory referral to Neurosurgery      2. Left leg numbness             Plan:  Patient is a 51 y.o. male presenting for injection follow-up. He had a left SI injection on 08/14/2021 with 50-60% relief lasting for four days. He continues to report a numbness and burning pain in the left thigh. He is working on getting his EMG/NCS scheduled. His lumbar spine MRI was notable for disc bulge at L3-4. I reviewed treatment options with the patient today including LESI, repeat SI injection, and PT. He would like to try an LESI. I will refer him to Dr. Brynda Greathouse for LESI. He will follow-up 2 weeks after his injection.    Freda Munro, PA-C  [facility]    Electronically signed by Freda Munro, PA-C on 08/28/2021 at 2:13 PM

## 2021-08-28 NOTE — Telephone Encounter (Signed)
Patient is scheduled for LESI on 09/11/2021 - I just double booked the 11:15 where there was an SI Joint.     Patient is Self-Pay/Applied for [facility] so it should be an easy process.    He said he does not take any Aspirin, and he has all of his paperwork in regards to his injection.

## 2021-09-10 NOTE — Telephone Encounter (Signed)
Pt states that he stopped his Gabapentin because it was making his head 'cloudy.' Pt states that his pain has gradually increased & he believes it is related to Gabapentin. Pt is having injection tomorrow & wants to see the results of the injection prior to RX or starting back on the Gabapentin. Pt would like a call back.

## 2021-09-11 ENCOUNTER — Ambulatory Visit: Admit: 2021-09-11 | Discharge: 2021-09-11 | Attending: Anesthesiology | Primary: Family

## 2021-09-11 DIAGNOSIS — M5442 Lumbago with sciatica, left side: Secondary | ICD-10-CM

## 2021-09-11 NOTE — Progress Notes (Signed)
Date: 09/11/21     Diagnosis: Low back pain with left radicular component    Referral: Katie Bowis    Procedure: Lumbar epidural steroid injection    Sedation: None    Level: Left L3-L4    Pain level pre procedure: 8 out of 10    Pain level post procedure: 7 out of 10      Procedure Note: The patient comes to the [facility]. [facility] for a lumbar epidural steroid injection.  The patient's major complaint is that of low back pain.  After a  discussion with the patient explaining the procedure, mechanism of action, hoped for benefit and risk involved including the risk of even permanent nerve damage or paralysis, informed consent was obtained.  The patient was taken to the block suite and placed in a prone position.  We then sterilely prepped and draped the area above the injection site.  Using 2 cc's of 1 % Xylocaine a skin wheal and the subcutaneous tissues were infiltrated for a good local anesthetic effect.  Then using an 18 gauge 3 1/2 inch Tuohy epidural needle I introduced it at the  level guided by biplanar fluoroscopy and loss of resistance technique.  Contrast was injected in the epidural space without vascular spread or intrathecal spread.  No blood, paresthesias or spinal fluid having been encountered 80 mg of Depo-Medrol mixed in 48ml of .25% Marcaine and 3 ml's of normal saline was injected into the epidural space.  The patient is resting in the post block suite and understands to follow up as directed.     Cyndie Mull, MD

## 2021-09-11 NOTE — Telephone Encounter (Signed)
Spoke with the patient. He states that his leg pain is spreading. He will hold off on resuming gabapentin until he knows how well his injection today works. He feels that he is still unable to work and I advised that he continue to discuss his work status with his PCP. His EMG/NCS is scheduled for mid-August. He will follow-up with me in 2 weeks for injection follow-up.

## 2021-09-11 NOTE — Progress Notes (Signed)
Date: 09/11/21     DX: Low back pain with left radicular component    PROCEDURE: L3-L4 ESI left      Patient is here for a therapeutic pain procedure with fluoroscopy.    Vitals: See nursing notes    Mental status: Alert and oriented x3    Cardiovascular: Regular rate and rhythm    Lungs: Clear to auscultation bilaterally    Neuro: Nonfocal    Assessment:  Patient here for therapeutic pain procedure.     Plan:  Will plan to proceed with scheduled pain procedure        Rondall Allegra MD

## 2021-09-16 NOTE — Telephone Encounter (Signed)
Spoke to the patient on the phone. He is released to work without restrictions.

## 2021-09-16 NOTE — Telephone Encounter (Signed)
Pt called stating his PCP has a work letter  allowing him to go back to work on 07/20, but the note states " waiting for Bowis to release " and he is confused due to have to see her for more appts, pt does not know what he needs to do. Pt would ike a callback at [phone].

## 2021-09-25 ENCOUNTER — Ambulatory Visit: Admit: 2021-09-25 | Discharge: 2021-09-25 | Attending: Physician Assistant | Primary: Family

## 2021-09-25 DIAGNOSIS — G8929 Other chronic pain: Secondary | ICD-10-CM

## 2021-09-25 MED ORDER — TIZANIDINE HCL 4 MG PO TABS
4 MG | ORAL_TABLET | Freq: Every evening | ORAL | 0 refills | Status: AC | PRN
Start: 2021-09-25 — End: 2021-10-25

## 2021-09-25 MED ORDER — MELOXICAM 7.5 MG PO TABS
7.5 MG | ORAL_TABLET | Freq: Every day | ORAL | 0 refills | Status: AC
Start: 2021-09-25 — End: 2021-10-25

## 2021-09-25 NOTE — Progress Notes (Signed)
Joe Barker  DOB 1970/04/07     LOV 08/28/2021 Patient is a 51 y.o. male presenting for injection follow-up. He had a left SI injection on 08/14/2021 with 50-60% relief lasting for four days. He continues to report a numbness and burning pain in the left thigh. He is working on getting his EMG/NCS scheduled. His lumbar spine MRI was notable for disc bulge at L3-4. I reviewed treatment options with the patient today including LESI, repeat SI injection, and PT. He would like to try an LESI. I will refer him to Dr. Brynda Greathouse for LESI. He will follow-up 2 weeks after his injection.    L3-4 LESI on 09/11/2021 with 30-40% of relief that lasted 4-5 days     Pain level today is 10/10      Joe Barker   DOB:1970-05-09     Reason for visit:   Chief Complaint   Patient presents with    Follow-up        History of present illness: Patient is a 51 y.o. male presenting for injection follow-up. He had an L3-4 LESI on 09/11/2021 with 30-40% relief that lasted 4-5 days. He continues to reports low back pain and left left leg numbness, now into the calf. His EMG/NCS is scheduled for 10/16/21.     Office visit 08/28/21: Patient is a 51 y.o. male presenting for injection follow-up. He had a left SI injection on 08/14/2021 with 50-60% relief lasting for four days. He states that the injection helped with some of his burning pain but he continues to experience pain in the low back and numbness in the left thigh. He has not yet scheduled his EMG/NCS yet.      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 He reports pain is unchanged from his last visit. He completed an MRI at [facility] on 07/26/21. He is here today to discuss the results.      Office visit 07/15/21: Patient is a 51 y.o. male presenting for evaluation of acute on chronic low back pain and left leg pain. He states pain originated after a work injury in 2010 but acutely worsened about 3 weeks ago  after starting a new job which requires a significant amount of heavy lifting. He states his pain radiates to the left anterior thigh with tingling and numbness. He reports leg weakness. Denies bowel or bladder incontinence and saddle anesthesia. He was seen in the [facility] about 3 weeks ago where a lumbar CT scan demonstrated mild DDD at L3-4, mild facet arthropathy at L3-S1, and mild b/l foraminal narrowing at L3-4. He has been taking Flexeril, Percocet, and prednisone for the pain and reports minimal relief. He denies recent injections or PT for low back pain. Denies previous spine surgery.    ROS:   A complete ROS based on the patients complaint was obtained today and appropriately  documented in the HPI.    Allergies   Allergen Reactions    Adhesive Tape     Morphine     Sulfites        Past Medical History:   Diagnosis Date    MRSA (methicillin resistant staph aureus) culture positive         Past Surgical History:   Procedure Laterality Date    KNEE SURGERY      STOMACH SURGERY           Current Outpatient Medications:  meloxicam (MOBIC) 7.5 MG tablet, Take 1 tablet by mouth daily, Disp: 30 tablet, Rfl: 0    tiZANidine (ZANAFLEX) 4 MG tablet, Take 1 tablet by mouth nightly as needed (muscle spasm), Disp: 30 tablet, Rfl: 0    ibuprofen (ADVIL;MOTRIN) 800 MG tablet, Take 1 tablet by mouth every 6 hours as needed for Pain, Disp: , Rfl:     oxyCODONE-acetaminophen (PERCOCET) 5-325 MG per tablet, Take 1 tablet by mouth every 6 hours as needed. (Patient not taking: Reported on 08/14/2021), Disp: , Rfl:     predniSONE (DELTASONE) 20 MG tablet, TAKE 3TABS/DAY X 2 DAYS, 2TABS/DAY X 2 DAYS, 1TAB/DAY X 2 DAYS, 1/2 TAB/DAYX 2 DAYS (Patient not taking: Reported on 07/15/2021), Disp: , Rfl:     gabapentin (NEURONTIN) 300 MG capsule, Take 1 capsule by mouth QHS X 5 days, take 1 capsule by mouth BID X 5 days then take 1 capsule by mouth TID to continue.  Hold for drowsiness or dizziness., Disp: 90 capsule, Rfl: 0        Physical Exam:  Patient seen and examined   General: Well developed. Alert and cooperative in no acute distress.     HENT: atraumatic, neck supple  Pulmonary: unlabored respiratory effort  Cardiovascular:  Warm well perfused. No peripheral edema    Neurologic Exam:  Neurological:  Mental Status: Awake, alert, oriented x 4, speech clear and appropriate  Attention: Intact    Musculoskeletal:   Gait: antalgic  Assist devices: None       Radiological Findings:  None since last visit    Assessment:   Diagnosis Orders   1. Chronic midline low back pain with left-sided sciatica  Annie Paras, MD - Pain Medicine    meloxicam (MOBIC) 7.5 MG tablet    tiZANidine (ZANAFLEX) 4 MG tablet      2. Left leg numbness        3. Lumbar radiculopathy             Plan:  Patient is a 51 y.o. male presenting for injection follow-up. He had an L3-4 LESI on 09/11/2021 with 30-40% relief that lasted 4-5 days. He continues to reports low back pain and left left leg numbness, now into the calf. His EMG/NCS is scheduled for 10/16/21. There are no surgical indications on his MRI. I will refer him today to pain management. I will send in Mobic and tizanidine for him. He will follow-up after his EMG/NCS.     Freda Munro, PA-C  [facility]    Electronically signed by Freda Munro, PA-C on 09/25/2021 at 3:02 PM

## 2021-10-16 ENCOUNTER — Ambulatory Visit: Admit: 2021-10-16 | Discharge: 2021-10-16 | Attending: Neurology | Primary: Family

## 2021-10-16 DIAGNOSIS — R202 Paresthesia of skin: Secondary | ICD-10-CM

## 2021-10-16 NOTE — Patient Instructions (Signed)
9

## 2021-10-16 NOTE — Progress Notes (Signed)
See NCS/EMG Report: Normal. Clinically consistent with meralgia paresthetica.

## 2021-11-13 ENCOUNTER — Ambulatory Visit: Admit: 2021-11-13 | Discharge: 2021-11-14 | Attending: Physician Assistant | Primary: Family

## 2021-11-13 DIAGNOSIS — G5712 Meralgia paresthetica, left lower limb: Secondary | ICD-10-CM

## 2021-11-13 NOTE — Progress Notes (Unsigned)
Joe Barker  DOB 1970/10/16     LOV 09/25/2021 Patient is a 51 y.o. male presenting for injection follow-up. He had an L3-4 LESI on 09/11/2021 with 30-40% relief that lasted 4-5 days. He continues to reports low back pain and left left leg numbness, now into the calf. His EMG/NCS is scheduled for 10/16/21. There are no surgical indications on his MRI. I will refer him today to pain management. I will send in Mobic and tizanidine for him. He will follow-up after his EMG/NCS.     Pain level today is /10

## 2021-12-12 ENCOUNTER — Encounter: Attending: Anesthesiology | Primary: Family

## 2021-12-27 ENCOUNTER — Encounter: Attending: Physician Assistant | Primary: Family

## 2021-12-27 NOTE — Progress Notes (Deleted)
Joe Barker   DOB 05-27-1970     LOV 11/13/2021 Patient is a 51 y.o. male presenting for EMG/NCS follow-up. His recent EMG with Dr. Ernst Spell was negative for lumbar radiculopathy or neuropathy. Dr. Ernst Spell commented that the patient's presentation was clinically consistent with meralgia paresthetica. I reviewed treatment options with the patient today including time, medications, injections, and other conservative care considerations such as weight loss and wearing loose clothing. He is interested in trying an injection. I will refer for a lateral femoral cutaneous nerve block today. He will follow-up 2 weeks after his injection.    Left lateral cutaneous nerve block injection on 12/12/2021 with % of relief   Pain level today is Joe Barker       Joe Barker   DOB:1971/02/06     Reason for visit: No chief complaint on file.       History of present illness: Patient is a 51 y.o. male presenting for evaluation of     ROS:   A complete ROS based on the patients complaint was obtained today and appropriately  documented in the HPI.    Allergies   Allergen Reactions    Adhesive Tape     Morphine     Sulfites        Past Medical History:   Diagnosis Date    MRSA (methicillin resistant staph aureus) culture positive         Past Surgical History:   Procedure Laterality Date    KNEE SURGERY      STOMACH SURGERY           Current Outpatient Medications:     cyclobenzaprine (FLEXERIL) 10 MG tablet, Take 1 tablet by mouth 3 times daily as needed for Muscle spasms, Disp: , Rfl:     meloxicam (MOBIC) 7.5 MG tablet, Take 1 tablet by mouth daily, Disp: 30 tablet, Rfl: 0    oxyCODONE-acetaminophen (PERCOCET) 5-325 MG per tablet, Take 1 tablet by mouth every 6 hours as needed. (Patient not taking: Reported on 08/14/2021), Disp: , Rfl:     predniSONE (DELTASONE) 20 MG tablet, TAKE 3TABS/DAY X 2 DAYS, 2TABS/DAY X 2 DAYS, 1TAB/DAY X 2 DAYS, 1/2 TAB/DAYX 2 DAYS (Patient not taking: Reported on 07/15/2021), Disp: , Rfl:     ibuprofen (ADVIL;MOTRIN)  800 MG tablet, Take 1 tablet by mouth every 6 hours as needed for Pain, Disp: , Rfl:     gabapentin (NEURONTIN) 300 MG capsule, Take 1 capsule by mouth QHS X 5 days, take 1 capsule by mouth BID X 5 days then take 1 capsule by mouth TID to continue.  Hold for drowsiness or dizziness., Disp: 90 capsule, Rfl: 0       Physical Exam:  Patient seen and examined   General: Well developed. Alert and cooperative in no acute distress.     HENT: atraumatic, neck supple  Pulmonary: unlabored respiratory effort  Cardiovascular:  Warm well perfused. No peripheral edema    Neurologic Exam:  Neurological:  Mental Status: Awake, alert, oriented x 4, speech clear and appropriate  Attention: Intact  Sensation: Intact to all extremities to light touch  SLR:  Hoffman's:  Ankle clonus:  DTRs:    Right  Left    biceps  2 2   brachioradialis  2 2    Patella  2  2   Achilles 2 2       Musculoskeletal:   Gait: ***  Assist devices: None   Tone:   Spine:   Motor  strength:    Right  Left    Right  Left    Deltoid  5 5  Hip Flex  5 5   Biceps  5 5  Quadriceps 5 5   Triceps  5 5  Hamstrings 5 5   Wrist Ext  5 5  Tibialis Anterior 5 5   Wrist Flex  5 5  Ankle Plantarflex.  5 5   Handgrip  5 5  Ext Hal Longus  5 5   Thumb Ext  5 5           Radiological Findings:  ***    Assessment:  {No diagnosis found. (Refresh or delete this SmartLink)}     Plan:      Eden Emms, MA  [facility]    Electronically signed by Eden Emms, MA on 12/19/2021 at 4:01 PM
